# Patient Record
Sex: Female | Born: 1975 | Race: White | Hispanic: No | Smoking: Never smoker
Health system: Southern US, Community
[De-identification: ages and names within clinical notes are randomized; demographics above are authoritative.]

## PROBLEM LIST (undated history)

## (undated) DIAGNOSIS — G4730 Sleep apnea, unspecified: Secondary | ICD-10-CM

## (undated) DIAGNOSIS — G5600 Carpal tunnel syndrome, unspecified upper limb: Secondary | ICD-10-CM

## (undated) DIAGNOSIS — D649 Anemia, unspecified: Secondary | ICD-10-CM

## (undated) DIAGNOSIS — R112 Nausea with vomiting, unspecified: Secondary | ICD-10-CM

## (undated) DIAGNOSIS — K649 Unspecified hemorrhoids: Secondary | ICD-10-CM

## (undated) DIAGNOSIS — O09529 Supervision of elderly multigravida, unspecified trimester: Secondary | ICD-10-CM

## (undated) DIAGNOSIS — F419 Anxiety disorder, unspecified: Secondary | ICD-10-CM

## (undated) DIAGNOSIS — O24419 Gestational diabetes mellitus in pregnancy, unspecified control: Secondary | ICD-10-CM

## (undated) DIAGNOSIS — M1990 Unspecified osteoarthritis, unspecified site: Secondary | ICD-10-CM

## (undated) DIAGNOSIS — Z9889 Other specified postprocedural states: Secondary | ICD-10-CM

## (undated) HISTORY — PX: TONSILLECTOMY AND ADENOIDECTOMY: SUR1326

## (undated) HISTORY — DX: Unspecified hemorrhoids: K64.9

## (undated) HISTORY — DX: Other specified postprocedural states: Z98.890

## (undated) HISTORY — DX: Morbid (severe) obesity due to excess calories: E66.01

## (undated) HISTORY — PX: CHOLECYSTECTOMY: SHX55

## (undated) HISTORY — DX: Nausea with vomiting, unspecified: R11.2

## (undated) HISTORY — DX: Gestational diabetes mellitus in pregnancy, unspecified control: O24.419

## (undated) HISTORY — DX: Carpal tunnel syndrome, unspecified upper limb: G56.00

---

## 2000-09-27 ENCOUNTER — Other Ambulatory Visit: Admission: RE | Admit: 2000-09-27 | Discharge: 2000-09-27 | Payer: Self-pay | Admitting: Obstetrics and Gynecology

## 2001-03-22 ENCOUNTER — Encounter (INDEPENDENT_AMBULATORY_CARE_PROVIDER_SITE_OTHER): Payer: Self-pay | Admitting: *Deleted

## 2001-03-22 ENCOUNTER — Ambulatory Visit (HOSPITAL_COMMUNITY): Admission: RE | Admit: 2001-03-22 | Discharge: 2001-03-23 | Payer: Self-pay | Admitting: General Surgery

## 2001-03-22 ENCOUNTER — Encounter: Payer: Self-pay | Admitting: General Surgery

## 2001-10-25 ENCOUNTER — Other Ambulatory Visit: Admission: RE | Admit: 2001-10-25 | Discharge: 2001-10-25 | Payer: Self-pay | Admitting: Obstetrics and Gynecology

## 2002-11-01 ENCOUNTER — Other Ambulatory Visit: Admission: RE | Admit: 2002-11-01 | Discharge: 2002-11-01 | Payer: Self-pay | Admitting: Obstetrics & Gynecology

## 2006-07-16 ENCOUNTER — Inpatient Hospital Stay (HOSPITAL_COMMUNITY): Admission: AD | Admit: 2006-07-16 | Discharge: 2006-07-19 | Payer: Self-pay | Admitting: *Deleted

## 2010-04-16 ENCOUNTER — Emergency Department (HOSPITAL_BASED_OUTPATIENT_CLINIC_OR_DEPARTMENT_OTHER): Admission: EM | Admit: 2010-04-16 | Discharge: 2010-04-16 | Payer: Self-pay | Admitting: Emergency Medicine

## 2010-05-16 NOTE — L&D Delivery Note (Signed)
Delivery Note At 4:58 PM a viable and healthy female was delivered via Vaginal, Spontaneous Delivery (Presentation: Left Occiput Anterior).  APGAR: 8, 9 weight .  8lb 11 oz Placenta status: Intact, Spontaneous.  Cord: 3 vessels with the following complications: None.  Cord pH: none  Anesthesia: Epidural  Episiotomy: None Lacerations: None Suture Repair: none Est. Blood Loss (mL): 400  Mom to postpartum.  Baby to nursery-stable.  Egbert Seidel A 04/16/2011, 5:29 PM

## 2010-07-27 LAB — DIFFERENTIAL
Basophils Absolute: 0.1 10*3/uL (ref 0.0–0.1)
Basophils Relative: 1 % (ref 0–1)
Eosinophils Absolute: 0.1 10*3/uL (ref 0.0–0.7)
Eosinophils Relative: 1 % (ref 0–5)
Lymphocytes Relative: 7 % — ABNORMAL LOW (ref 12–46)
Lymphs Abs: 1.1 10*3/uL (ref 0.7–4.0)
Monocytes Absolute: 1.1 10*3/uL — ABNORMAL HIGH (ref 0.1–1.0)
Monocytes Relative: 7 % (ref 3–12)
Neutro Abs: 13.7 10*3/uL — ABNORMAL HIGH (ref 1.7–7.7)
Neutrophils Relative %: 85 % — ABNORMAL HIGH (ref 43–77)

## 2010-07-27 LAB — URINALYSIS, ROUTINE W REFLEX MICROSCOPIC
Bilirubin Urine: NEGATIVE
Glucose, UA: NEGATIVE mg/dL
Hgb urine dipstick: NEGATIVE
Ketones, ur: NEGATIVE mg/dL
Nitrite: NEGATIVE
Protein, ur: NEGATIVE mg/dL
Specific Gravity, Urine: 1.004 — ABNORMAL LOW (ref 1.005–1.030)
Urobilinogen, UA: 1 mg/dL (ref 0.0–1.0)
pH: 6.5 (ref 5.0–8.0)

## 2010-07-27 LAB — CBC
HCT: 38.6 % (ref 36.0–46.0)
Hemoglobin: 13.2 g/dL (ref 12.0–15.0)
MCH: 29.8 pg (ref 26.0–34.0)
MCHC: 34.1 g/dL (ref 30.0–36.0)
MCV: 87.4 fL (ref 78.0–100.0)
Platelets: 255 10*3/uL (ref 150–400)
RBC: 4.42 MIL/uL (ref 3.87–5.11)
RDW: 12.3 % (ref 11.5–15.5)
WBC: 16.1 10*3/uL — ABNORMAL HIGH (ref 4.0–10.5)

## 2010-07-27 LAB — BASIC METABOLIC PANEL
BUN: 11 mg/dL (ref 6–23)
CO2: 25 mEq/L (ref 19–32)
Calcium: 8.7 mg/dL (ref 8.4–10.5)
Chloride: 102 mEq/L (ref 96–112)
Creatinine, Ser: 0.5 mg/dL (ref 0.4–1.2)
GFR calc Af Amer: >60 mL/min (ref >60)
GFR calc non Af Amer: >60 mL/min (ref >60)
Glucose, Bld: 113 mg/dL — ABNORMAL HIGH (ref 70–99)
Potassium: 4.7 mEq/L (ref 3.5–5.1)
Sodium: 140 mEq/L (ref 135–145)

## 2010-07-27 LAB — GC/CHLAMYDIA PROBE AMP, GENITAL
Chlamydia, DNA Probe: NEGATIVE
GC Probe Amp, Genital: NEGATIVE

## 2010-07-27 LAB — WET PREP, GENITAL
Trich, Wet Prep: NONE SEEN
Yeast Wet Prep HPF POC: NONE SEEN

## 2010-07-27 LAB — PREGNANCY, URINE: Preg Test, Ur: NEGATIVE

## 2010-08-23 LAB — HIV ANTIBODY (ROUTINE TESTING W REFLEX): HIV: NONREACTIVE

## 2010-08-23 LAB — RUBELLA ANTIBODY, IGM: Rubella: IMMUNE

## 2010-08-23 LAB — RPR: RPR: NONREACTIVE

## 2010-09-17 ENCOUNTER — Other Ambulatory Visit: Payer: Self-pay | Admitting: Obstetrics and Gynecology

## 2010-09-17 LAB — GC/CHLAMYDIA PROBE AMP, GENITAL: Gonorrhea: NEGATIVE

## 2010-10-01 NOTE — Op Note (Signed)
[facility]  Patient:    Katie [name], Katie [name] Visit Number: [id number] MRN: [id number]          Service Type: DSU Location: [phone] Attending Physician:  Arlis Porta Dictated by:   Adolph Pollack, M.D. Admit Date:  03/22/2001   CC:         Barbette Hair. Arlyce Dice, M.D. [facility]  Doreatha Lew, M.D.   Operative Report  PREOPERATIVE DIAGNOSIS:  Symptomatic cholelithiasis.  POSTOPERATIVE DIAGNOSIS:  Chronic calculous cholecystitis.  PROCEDURE:  Laparoscopic cholecystectomy with intraoperative cholangiogram.  SURGEON:  Adolph Pollack, M.D.  ASSISTANT:  Anselm Pancoast. Zachery Dakins, M.D.  ANESTHESIA:  General.  INDICATION:  Katie [name] is a 35 year old female who is 5 feet 2 inches tall and 300 pounds.  She has been having some epigastric and right upper quadrant pain for approximately the past seven to eight months.  She has had an evaluation by Dr. Arlyce Dice.  She was placed on a proton pump inhibitor, but her symptoms continued.  An ultrasound demonstrated multiple gallstones with a common bile duct diameter of 7.2 mm.  She has a mild elevation of her AST and ALT on laboratory evaluation.  The frequency of her episodes of pain was increasing, and now she is brought to the operating room for elective cholecystectomy.  The procedure and risks were explained to her preoperatively.  DESCRIPTION OF PROCEDURE:  She was placed supine on the operating table and a general anesthetic was administered.  She had been given subcutaneous heparin prior to the operation.  She was also given intravenous ciprofloxacin.  The abdomen was sterilely prepped and draped.  A local anesthetic was infiltrated in the supraumbilical region and a supraumbilical incision made, incising the skin sharply.  Using blunt dissection, the subcutaneous tissue was separated and midline fascia identified.  A 1-1.5 cm incision was made at the midline fascia.  A  pursestring suture was placed around the fascial edges.  Using blunt dissection and under direct vision, the peritoneal cavity was entered. A Hasson trocar was introduced into the peritoneal cavity and a pneumoperitoneum created by insufflation of CO2 gas.  The patient was then placed in the appropriate position, and the laparoscope was introduced.  The fundus of the gallbladder could be seen and was partially intrahepatic.  Under direct visualization an 11 mm trocar was placed into the abdominal cavity through an epigastric incision and two 5 mm trocars placed in the right midabdomen into the abdominal cavity under direct vision.  The fundus of the gallbladder was grasped and the infundibulum was also manipulated and completely mobilized.  I was able to identify the cystic duct and isolate it, create a window around it.  I also identified the cystic artery and created a window around it.  I placed a clip at the gallbladder-cystic duct junction.  Because of abnormal liver function tests, a cholangiogram was planned.  I made an incision in the neck of the gallbladder and then passed a cholangiocatheter into the abdominal cavity to the anterior abdominal wall.  I attempted to pass the cholangiocatheter; hoewver, a cystic duct valve prevented me from passing it to an adequate length.  I subsequently made an incision lower onto the cystic duct and was able to pass the cholangiocatheter into the cystic duct.  I then performed a cholangiogram.  Under real-time fluoroscopy, dilute contrast material was injected into the cystic duct.  The cystic duct was visualized, as was the common bile duct. Contrast emptied promptly into  the duodenum with no obvious evidence of a biliary ductal obstruction.  The final report is pending the radiologists interpretation.  Next I clipped the cystic duct three times on the staying end side and divided it.  The cystic artery was identified and clipped and  divided.  The gallbladder was then dissected free from the liver bed intact with the cautery.  Bleeding points were controlled with the cautery.  The gallbladder fossa was irrigated and no further bleeding or bile leakage was noted.  The gallbladder was then placed in an Endopouch bag.  The gallbladder fossa was once again examined, and no bleeding or bile leakage was noted.  The irrigation fluid was evacuated.  The gallbladder was removed through the supraumbilical incision in the Endopouch bag.  The subumbilical fascial defect was then closed under direct laparoscopic vision by tightening up and tying down the pursestring suture.  Further irrigation fluid was then evacuated, and all trocars were removed and pneumoperitoneum was released.  The skin incisions were then closed with 4-0 Monocryl subcuticular stitches.  Steri-Strips and sterile dressings were applied.  She tolerated the procedure well without any apparent complications and was taken to the recovery room in satisfactory condition. Dictated by:   Adolph Pollack, M.D. Attending Physician:  Arlis Porta DD:  03/22/01 TD:  03/23/01 Job: [id number]

## 2011-01-27 LAB — RPR: RPR: NONREACTIVE

## 2011-02-23 ENCOUNTER — Encounter: Payer: BC Managed Care – PPO | Attending: Obstetrics and Gynecology | Admitting: Dietician

## 2011-02-23 DIAGNOSIS — O99810 Abnormal glucose complicating pregnancy: Secondary | ICD-10-CM | POA: Insufficient documentation

## 2011-02-23 DIAGNOSIS — Z713 Dietary counseling and surveillance: Secondary | ICD-10-CM | POA: Insufficient documentation

## 2011-02-24 ENCOUNTER — Encounter: Payer: Self-pay | Admitting: Dietician

## 2011-02-24 NOTE — Progress Notes (Addendum)
  Patient was seen on 02/23/2011 for Gestational Diabetes self-management class at the [facility]. The following learning objectives were met by the patient during this course:   States the definition of Gestational Diabetes  States why dietary management is important in controlling blood glucose  Describes the effects each nutrient has on blood glucose levels  Demonstrates ability to create a balanced meal plan  Demonstrates carbohydrate counting   States when to check blood glucose levels  Demonstrates proper blood glucose monitoring techniques  States the effect of stress and exercise on blood glucose levels  States the importance of limiting caffeine and abstaining from alcohol and smoking  Blood glucose monitor given: Has her own glucose meter Blood Glucose reading:77  Patient instructed to monitor glucose levels:Fasting and two hours post meals. FBS: 60 - <90 1 hour: <140 2 hour: <120  *Patient received handouts:  Nutrition Diabetes and Pregnancy  Carbohydrate Counting List  Patient will be seen for follow-up as needed.

## 2011-03-21 LAB — STREP B DNA PROBE: GBS: NEGATIVE

## 2011-04-11 ENCOUNTER — Telehealth (HOSPITAL_COMMUNITY): Payer: Self-pay | Admitting: *Deleted

## 2011-04-11 ENCOUNTER — Encounter (HOSPITAL_COMMUNITY): Payer: Self-pay | Admitting: *Deleted

## 2011-04-11 NOTE — Telephone Encounter (Signed)
Preadmission screen  

## 2011-04-12 ENCOUNTER — Encounter (HOSPITAL_COMMUNITY): Payer: Self-pay | Admitting: *Deleted

## 2011-04-12 ENCOUNTER — Telehealth (HOSPITAL_COMMUNITY): Payer: Self-pay | Admitting: *Deleted

## 2011-04-12 NOTE — Telephone Encounter (Signed)
Preadmission screen  

## 2011-04-15 ENCOUNTER — Inpatient Hospital Stay (HOSPITAL_COMMUNITY)
Admission: RE | Admit: 2011-04-15 | Discharge: 2011-04-18 | DRG: 372 | Disposition: A | Discharge status: Home / Self Care | Payer: BC Managed Care – PPO | Source: Ambulatory Visit | Attending: Obstetrics and Gynecology | Admitting: Obstetrics and Gynecology

## 2011-04-15 ENCOUNTER — Encounter (HOSPITAL_COMMUNITY): Payer: Self-pay

## 2011-04-15 DIAGNOSIS — O99814 Abnormal glucose complicating childbirth: Principal | ICD-10-CM | POA: Diagnosis present

## 2011-04-15 DIAGNOSIS — E669 Obesity, unspecified: Secondary | ICD-10-CM | POA: Diagnosis present

## 2011-04-15 DIAGNOSIS — O24419 Gestational diabetes mellitus in pregnancy, unspecified control: Secondary | ICD-10-CM | POA: Diagnosis present

## 2011-04-15 HISTORY — DX: Sleep apnea, unspecified: G47.30

## 2011-04-15 LAB — CBC
Hemoglobin: 11 g/dL — ABNORMAL LOW (ref 12.0–15.0)
MCHC: 32.6 g/dL (ref 30.0–36.0)
Platelets: 272 10*3/uL (ref 150–400)
RBC: 4.1 MIL/uL (ref 3.87–5.11)

## 2011-04-15 LAB — GLUCOSE, CAPILLARY: Glucose-Capillary: 94 mg/dL (ref 70–99)

## 2011-04-15 MED ORDER — ACETAMINOPHEN 325 MG PO TABS: 650.0000 mg | ORAL_TABLET | ORAL | Status: DC | PRN

## 2011-04-15 MED ORDER — OXYCODONE-ACETAMINOPHEN 5-325 MG PO TABS: 2.0000 | ORAL_TABLET | ORAL | Status: DC | PRN

## 2011-04-15 MED ORDER — OXYTOCIN 10 UNIT/ML IJ SOLN: 10.0000 [IU] | Freq: Once | INTRAMUSCULAR | Status: DC

## 2011-04-15 MED ORDER — OXYTOCIN 20 UNITS IN LACTATED RINGERS INFUSION - SIMPLE: 125.0000 mL/h | Freq: Once | INTRAVENOUS | Status: DC

## 2011-04-15 MED ORDER — TERBUTALINE SULFATE 1 MG/ML IJ SOLN: 0.2500 mg | Freq: Once | INTRAMUSCULAR | Status: AC | PRN

## 2011-04-15 MED ORDER — MISOPROSTOL 25 MCG QUARTER TABLET
25.0000 ug | ORAL_TABLET | ORAL | Status: DC | PRN
  Administered 2011-04-15 – 2011-04-16 (×3): 25 ug via VAGINAL
  Filled 2011-04-15 (×3): qty 0.25

## 2011-04-15 MED ORDER — ZOLPIDEM TARTRATE 10 MG PO TABS
10.0000 mg | ORAL_TABLET | Freq: Every evening | ORAL | Status: DC | PRN
  Administered 2011-04-15: 10 mg via ORAL
  Filled 2011-04-15: qty 1

## 2011-04-15 MED ORDER — LIDOCAINE HCL (PF) 1 % IJ SOLN
30.0000 mL | INTRAMUSCULAR | Status: DC | PRN
  Filled 2011-04-15: qty 30

## 2011-04-15 MED ORDER — ONDANSETRON HCL 4 MG/2ML IJ SOLN
4.0000 mg | Freq: Four times a day (QID) | INTRAMUSCULAR | Status: DC | PRN
  Administered 2011-04-16: 4 mg via INTRAVENOUS
  Filled 2011-04-15: qty 2

## 2011-04-15 MED ORDER — LACTATED RINGERS IV SOLN
500.0000 mL | INTRAVENOUS | Status: DC | PRN
  Administered 2011-04-16: 1000 mL via INTRAVENOUS

## 2011-04-15 MED ORDER — LACTATED RINGERS IV SOLN
INTRAVENOUS | Status: DC
  Administered 2011-04-15 – 2011-04-16 (×4): via INTRAVENOUS

## 2011-04-15 MED ORDER — OXYTOCIN 20 UNITS IN LACTATED RINGERS INFUSION - SIMPLE
1.0000 m[IU]/min | INTRAVENOUS | Status: DC
  Administered 2011-04-16: 2 m[IU]/min via INTRAVENOUS
  Filled 2011-04-15: qty 1000

## 2011-04-15 MED ORDER — OXYTOCIN BOLUS FROM INFUSION
500.0000 mL | Freq: Once | INTRAVENOUS | Status: DC
  Filled 2011-04-15: qty 500

## 2011-04-15 MED ORDER — CITRIC ACID-SODIUM CITRATE 334-500 MG/5ML PO SOLN
30.0000 mL | ORAL | Status: DC | PRN
  Administered 2011-04-15: 30 mL via ORAL
  Filled 2011-04-15: qty 15

## 2011-04-15 MED ORDER — IBUPROFEN 600 MG PO TABS: 600.0000 mg | ORAL_TABLET | Freq: Four times a day (QID) | ORAL | Status: DC | PRN

## 2011-04-15 NOTE — Plan of Care (Signed)
Problem: Consults Goal: Birthing Suites Patient Information Press F2 to bring up selections list Outcome: Completed/Met Date Met:  04/15/11  Pt 37-[redacted] weeks EGA  Comments:  39.3 week induction for gestational diabetes

## 2011-04-15 NOTE — H&P (Signed)
Katie [name] is a 35 y.o. female G2P1001 MWF now @ 39 3/[redacted] weeks gestation presenting for induction 2nd to Class A2 gestational diabetes. History OB History    Grav Para Term Preterm Abortions TAB SAB Ect Mult Living   2 1 1       1      Past Medical History  Diagnosis Date  . Gestational diabetes   . Morbid obesity   . Carpal tunnel syndrome     only first pregnancy  . Asthma     as a child  . PONV (postoperative nausea and vomiting)   . Sleep apnea    Past Surgical History  Procedure Date  . Cholecystectomy     2002   Family History: family history includes Diabetes in her maternal grandfather and mother; Hypertension in her mother; Migraines in her mother; Multiple sclerosis in her mother; and Thyroid disease in her mother.  There is no history of Anesthesia problems, and Hypotension, and Malignant hyperthermia, and Pseudochol deficiency, . Social History:  reports that she has never smoked. She has never used smokeless tobacco. She reports that she does not drink alcohol or use illicit drugs.  ROS neg  Dilation: 1 Effacement (%): 50 Station: -3 Exam by:: Katie [name], Katie [name] Height 5\' 2"  (1.575 m), weight 141.976 kg (313 lb). Maternal Exam:  Uterine Assessment: Contraction frequency is irregular.   Abdomen: Patient reports no abdominal tenderness. Fetal presentation: vertex  Introitus: Normal vulva. Ferning test: not done.  Nitrazine test: not done.  Pelvis: adequate for delivery.   Cervix: Cervix evaluated by digital exam.     Physical Exam  Constitutional: She appears well-developed and well-nourished.  HENT:  Head: Normocephalic.  Eyes: EOM are normal.  Neck: Neck supple.  Cardiovascular: Normal rate and regular rhythm.   Respiratory: Breath sounds normal.  GI: Bowel sounds are normal.  Musculoskeletal: She exhibits edema.       2(+)  Skin: Skin is warm and dry.  Psychiatric: She has a normal mood and affect.    Prenatal labs: ABO, Rh:  B  positive Antibody:  neg Rubella:  Immune RPR:   NR HBsAg:   neg HIV:   neg GBS: Negative (11/05 0000)  Tracing reactive irreg ctx Assessment/Plan: Class A2 GDM Term gestation P) Admit routine labs cytotec. CBG q 4 hr low dose pitocin in am. Analgesic   Katie [name] A 04/15/2011, 11:17 PM

## 2011-04-16 ENCOUNTER — Encounter (HOSPITAL_COMMUNITY): Payer: Self-pay

## 2011-04-16 ENCOUNTER — Inpatient Hospital Stay (HOSPITAL_COMMUNITY): Payer: BC Managed Care – PPO | Admitting: Anesthesiology

## 2011-04-16 ENCOUNTER — Encounter (HOSPITAL_COMMUNITY): Payer: Self-pay | Admitting: Anesthesiology

## 2011-04-16 LAB — GLUCOSE, CAPILLARY
Glucose-Capillary: 88 mg/dL (ref 70–99)
Glucose-Capillary: 79 mg/dL (ref 70–99)

## 2011-04-16 LAB — RPR: RPR Ser Ql: NONREACTIVE

## 2011-04-16 MED ORDER — EPHEDRINE 5 MG/ML INJ: 10.0000 mg | INTRAVENOUS | Status: DC | PRN

## 2011-04-16 MED ORDER — WITCH HAZEL-GLYCERIN EX PADS: 1.0000 "application " | MEDICATED_PAD | CUTANEOUS | Status: DC | PRN

## 2011-04-16 MED ORDER — ONDANSETRON HCL 4 MG PO TABS: 4.0000 mg | ORAL_TABLET | ORAL | Status: DC | PRN

## 2011-04-16 MED ORDER — OXYCODONE-ACETAMINOPHEN 5-325 MG PO TABS: 1.0000 | ORAL_TABLET | ORAL | Status: DC | PRN

## 2011-04-16 MED ORDER — BENZOCAINE-MENTHOL 20-0.5 % EX AERO
INHALATION_SPRAY | CUTANEOUS | Status: AC
  Administered 2011-04-16: 1 via TOPICAL
  Filled 2011-04-16: qty 56

## 2011-04-16 MED ORDER — FENTANYL 2.5 MCG/ML BUPIVACAINE 1/10 % EPIDURAL INFUSION (WH - ANES)
14.0000 mL/h | INTRAMUSCULAR | Status: DC
  Administered 2011-04-16 (×2): 14 mL/h via EPIDURAL
  Filled 2011-04-16 (×2): qty 60

## 2011-04-16 MED ORDER — PHENYLEPHRINE 40 MCG/ML (10ML) SYRINGE FOR IV PUSH (FOR BLOOD PRESSURE SUPPORT): 80.0000 ug | PREFILLED_SYRINGE | INTRAVENOUS | Status: DC | PRN

## 2011-04-16 MED ORDER — DIPHENHYDRAMINE HCL 50 MG/ML IJ SOLN: 12.5000 mg | INTRAMUSCULAR | Status: DC | PRN

## 2011-04-16 MED ORDER — EPHEDRINE 5 MG/ML INJ
10.0000 mg | INTRAVENOUS | Status: DC | PRN
  Filled 2011-04-16: qty 4

## 2011-04-16 MED ORDER — PHENYLEPHRINE 40 MCG/ML (10ML) SYRINGE FOR IV PUSH (FOR BLOOD PRESSURE SUPPORT)
80.0000 ug | PREFILLED_SYRINGE | INTRAVENOUS | Status: DC | PRN
  Filled 2011-04-16: qty 5

## 2011-04-16 MED ORDER — NALBUPHINE HCL 10 MG/ML IJ SOLN
10.0000 mg | INTRAMUSCULAR | Status: DC | PRN
  Filled 2011-04-16: qty 1

## 2011-04-16 MED ORDER — SIMETHICONE 80 MG PO CHEW
80.0000 mg | CHEWABLE_TABLET | ORAL | Status: DC | PRN
  Administered 2011-04-16: 80 mg via ORAL

## 2011-04-16 MED ORDER — LANOLIN HYDROUS EX OINT: TOPICAL_OINTMENT | CUTANEOUS | Status: DC | PRN

## 2011-04-16 MED ORDER — TETANUS-DIPHTH-ACELL PERTUSSIS 5-2.5-18.5 LF-MCG/0.5 IM SUSP
0.5000 mL | Freq: Once | INTRAMUSCULAR | Status: AC
  Administered 2011-04-17: 0.5 mL via INTRAMUSCULAR
  Filled 2011-04-16: qty 0.5

## 2011-04-16 MED ORDER — DIPHENHYDRAMINE HCL 25 MG PO CAPS: 25.0000 mg | ORAL_CAPSULE | Freq: Four times a day (QID) | ORAL | Status: DC | PRN

## 2011-04-16 MED ORDER — ZOLPIDEM TARTRATE 5 MG PO TABS: 5.0000 mg | ORAL_TABLET | Freq: Every evening | ORAL | Status: DC | PRN

## 2011-04-16 MED ORDER — PRENATAL PLUS 27-1 MG PO TABS
1.0000 | ORAL_TABLET | Freq: Every day | ORAL | Status: DC
  Administered 2011-04-17 – 2011-04-18 (×3): 1 via ORAL
  Filled 2011-04-16 (×2): qty 1

## 2011-04-16 MED ORDER — LACTATED RINGERS IV SOLN
500.0000 mL | Freq: Once | INTRAVENOUS | Status: AC
  Administered 2011-04-16: 1000 mL via INTRAVENOUS

## 2011-04-16 MED ORDER — DIBUCAINE 1 % RE OINT: 1.0000 "application " | TOPICAL_OINTMENT | RECTAL | Status: DC | PRN

## 2011-04-16 MED ORDER — BENZOCAINE-MENTHOL 20-0.5 % EX AERO
1.0000 "application " | INHALATION_SPRAY | CUTANEOUS | Status: DC | PRN
  Administered 2011-04-16: 1 via TOPICAL

## 2011-04-16 MED ORDER — SENNOSIDES-DOCUSATE SODIUM 8.6-50 MG PO TABS
2.0000 | ORAL_TABLET | Freq: Every day | ORAL | Status: DC
  Administered 2011-04-18: 2 via ORAL

## 2011-04-16 MED ORDER — IBUPROFEN 600 MG PO TABS
600.0000 mg | ORAL_TABLET | Freq: Four times a day (QID) | ORAL | Status: DC
  Administered 2011-04-16 – 2011-04-18 (×6): 600 mg via ORAL
  Filled 2011-04-16 (×7): qty 1

## 2011-04-16 MED ORDER — ONDANSETRON HCL 4 MG/2ML IJ SOLN: 4.0000 mg | INTRAMUSCULAR | Status: DC | PRN

## 2011-04-16 MED ORDER — LIDOCAINE HCL 1.5 % IJ SOLN
INTRAMUSCULAR | Status: DC | PRN
  Administered 2011-04-16 (×2): 5 mL via INTRADERMAL
  Administered 2011-04-16: 2 mL via INTRADERMAL

## 2011-04-16 NOTE — Progress Notes (Signed)
S: feels ok  S/p cytotec x 3  O: BS 90 VE deferred  Tracing; baseline 120  Reactive Ctx q 2-3 mins  IMP; Class A2 GDM Term gestation P) if ctx too freq, start pitocin otherwise repeat cytotec if cervix unchanged

## 2011-04-16 NOTE — Progress Notes (Signed)
Viable female infant via SVD via marie williams, CNM--apgars 8&9

## 2011-04-16 NOTE — Progress Notes (Signed)
Dedria Endres is a 35 y.o. G2P1001 at [redacted]w[redacted]d by ultrasound admitted for induction of labor due to Gestational diabetes.( Class A2)  Subjective: no complaint No chief complaint on file. Pitocin   Epidural  Objective: BP 127/68  Pulse 69  Temp(Src) 98.2 F (36.8 C) (Oral)  Resp 18  Ht 5\' 2"  (1.575 m)  Wt 141.976 kg (313 lb)  BMI 57.25 kg/m2  SpO2 100%      FHT:  FHR: 135-140 bpm, variability: moderate,  accelerations:  Present,  decelerations:  Absent UC:   regular, every 2 minutes SVE:   4/60/-3  Arom clear fluid. IUPC, ISE placed Labs: Lab Results  Component Value Date   WBC 11.2* 04/15/2011   HGB 11.0* 04/15/2011   HCT 33.7* 04/15/2011   MCV 82.2 04/15/2011   PLT 272 04/15/2011   BS 79 Assessment / Plan: Class A2 GDM Term P) Exaggerated left Sims. Cont pitocin  Cont BS check Anticipated MOD:  NSVD  Meline Russaw A 04/16/2011, 1:52 PM

## 2011-04-16 NOTE — Anesthesia Procedure Notes (Signed)
Epidural Patient location during procedure: OB Start time: 04/16/2011 11:51 AM Reason for block: procedure for pain  Staffing Performed by: anesthesiologist   Preanesthetic Checklist Completed: patient identified, site marked, surgical consent, pre-op evaluation, timeout performed, IV checked, risks and benefits discussed and monitors and equipment checked  Epidural Patient position: sitting Prep: site prepped and draped and DuraPrep Patient monitoring: continuous pulse ox and blood pressure Approach: midline Injection technique: LOR air  Needle:  Needle type: Tuohy  Needle gauge: 17 G Needle length: 9 cm Needle insertion depth: 7 cm Catheter type: closed end flexible Catheter size: 19 Gauge Catheter at skin depth: 12 cm Test dose: negative  Assessment Events: blood not aspirated, injection not painful, no injection resistance, negative IV test and no paresthesia  Additional Notes Discussed risk of headache, infection, bleeding, nerve injury and failed or incomplete block.  Patient voices understanding and wishes to proceed.

## 2011-04-16 NOTE — Anesthesia Preprocedure Evaluation (Signed)
Anesthesia Evaluation  Patient identified by MRN, date of birth, ID band Patient awake    Reviewed: Allergy & Precautions, H&P , NPO status , Patient's Chart, lab work & pertinent test results, reviewed documented beta blocker date and time   History of Anesthesia Complications (+) PONV  Airway Mallampati: III TM Distance: >3 FB Neck ROM: full    Dental  (+) Teeth Intact   Pulmonary neg pulmonary ROS, sleep apnea and Continuous Positive Airway Pressure Ventilation ,  clear to auscultation        Cardiovascular neg cardio ROS regular Normal    Neuro/Psych Negative Neurological ROS  Negative Psych ROS   GI/Hepatic negative GI ROS, Neg liver ROS,   Endo/Other  Diabetes mellitus-, Gestational, Oral Hypoglycemic AgentsMorbid obesity  Renal/GU negative Renal ROS     Musculoskeletal   Abdominal   Peds  Hematology negative hematology ROS (+)   Anesthesia Other Findings   Reproductive/Obstetrics (+) Pregnancy                           Anesthesia Physical Anesthesia Plan  ASA: III  Anesthesia Plan: Epidural   Post-op Pain Management:    Induction:   Airway Management Planned:   Additional Equipment:   Intra-op Plan:   Post-operative Plan:   Informed Consent: I have reviewed the patients History and Physical, chart, labs and discussed the procedure including the risks, benefits and alternatives for the proposed anesthesia with the patient or authorized representative who has indicated his/her understanding and acceptance.     Plan Discussed with:   Anesthesia Plan Comments:         Anesthesia Quick Evaluation

## 2011-04-16 NOTE — Progress Notes (Signed)
0900- cbg 103

## 2011-04-17 LAB — CBC
HCT: 27.4 % — ABNORMAL LOW (ref 36.0–46.0)
MCH: 27.1 pg (ref 26.0–34.0)
MCHC: 32.8 g/dL (ref 30.0–36.0)
MCV: 82.5 fL (ref 78.0–100.0)
RDW: 15.3 % (ref 11.5–15.5)

## 2011-04-17 NOTE — Anesthesia Postprocedure Evaluation (Signed)
  Anesthesia Post-op Note  Patient: Katie [name]  Procedure(s) Performed: * No procedures listed *  Patient Location: Mother/Baby  Anesthesia Type: Epidural  Level of Consciousness: awake, alert  and oriented  Airway and Oxygen Therapy: Patient Spontanous Breathing  Post-op Assessment: Patient's Cardiovascular Status Stable and Respiratory Function Stable  Post-op Vital Signs: Reviewed and stable  Complications: No apparent anesthesia complications

## 2011-04-17 NOTE — Progress Notes (Signed)
PPD#1} SVD:   S:  Pt reports feeling well Tolerating po/ Voiding without problems/ No n/v/ Bleeding is mild / Pain controlled withibuprofen (OTC)  Newborn info live female  O:  A & O x 3  VS: Blood pressure 96/56, pulse 67, temperature 98 F (36.7 C), temperature source Oral, resp. rate 20, height 5\' 2"  (1.575 m), weight 313 lb (141.976 kg), SpO2 96.00%, unknown if currently breastfeeding.  LABS:  Results for orders placed during the hospital encounter of 04/15/11 (from the past 24 hour(s))  GLUCOSE, CAPILLARY     Status: Abnormal   Collection Time   04/16/11  9:13 AM      Component Value Range   Glucose-Capillary 103 (*) 70 - 99 (mg/dL)  GLUCOSE, CAPILLARY     Status: Normal   Collection Time   04/16/11 12:55 PM      Component Value Range   Glucose-Capillary 79  70 - 99 (mg/dL)  CBC     Status: Abnormal   Collection Time   04/17/11  5:00 AM      Component Value Range   WBC 14.1 (*) 4.0 - 10.5 (K/uL)   RBC 3.32 (*) 3.87 - 5.11 (MIL/uL)   Hemoglobin 9.0 (*) 12.0 - 15.0 (g/dL)   HCT 40.9 (*) 81.1 - 46.0 (%)   MCV 82.5  78.0 - 100.0 (fL)   MCH 27.1  26.0 - 34.0 (pg)   MCHC 32.8  30.0 - 36.0 (g/dL)   RDW 91.4  78.2 - 95.6 (%)   Platelets 228  150 - 400 (K/uL)    I&O: I/O last 3 completed shifts: In: -  Out: 950 [Urine:550; Blood:400]      Lungs: chest clear, no wheezing, rales, normal symmetric air entry, Heart exam - S1, S2 normal, no murmur, no gallop, rate regular  Heart: regular rate and rhythm, S1, S2 normal, no murmur, click, rub or gallop  Abdomen: benign non-tender, without masses or organomegaly palpable juterus 1 FB above umbilicus firm nontender  Perineum: is normal/intact  Lochia: mod  Extremities:no redness or tenderness in the calves or thighs, no edema, edema 2+    A/P: PPD #1/ [id number]  2) Class A2 GDM  Doing well  Continue routine post partum orders Anticipate d/c tomorrow. 2hr GTT 8 wk pp

## 2011-04-18 MED ORDER — IBUPROFEN 600 MG PO TABS: 600.0000 mg | ORAL_TABLET | Freq: Four times a day (QID) | ORAL | Status: AC | PRN

## 2011-04-18 NOTE — Progress Notes (Signed)
Patient ID: Katie [name], female   DOB: May 09, 1976, 35 y.o.   MRN: [id number] PPD 2 SVD  S:  Reports feeling well, desires dc this am             Tolerating po/ No nausea or vomiting             Bleeding is spotting             Pain controlled withibuprofen (OTC)             Up ad lib / ambulatory  Newborn breast feeding    O:  A & O x 3, NAD, pleasant affect             VS: Blood pressure 127/72, pulse 77, temperature 97.9 F (36.6 C), temperature source Oral, resp. rate 20, height 5\' 2"  (1.575 m), weight 141.976 kg (313 lb), SpO2 95.00%, unknown if currently breastfeeding.  LABS: Lab Results  Component Value Date   WBC 14.1* 04/17/2011   HGB 9.0* 04/17/2011   HCT 27.4* 04/17/2011   MCV 82.5 04/17/2011   PLT 228 04/17/2011     I&O: I/O last 3 completed shifts: In: -  Out: 550 [Urine:550]      Lungs: Clear and unlabored  Heart: regular rate and rhythm / no mumurs  Abdomen: soft, non-tender, non-distended             Fundus: firm, non-tender, U-1  Perineum: intact  Lochia: small, rubra  Extremities: slight dependent edema    A: PPD # 2  Doing well - stable status  Breastfeeding well P:  Routine post partum orders  DC home with 6 week f/u, including 2hr OGT  Lactation consultation prn; education on methods to decrease duration to lactogenesis II  Juanetta Beets, [facility] 04/18/2011, 9:24 AM

## 2011-04-18 NOTE — Discharge Summary (Signed)
Obstetric Discharge Summary Reason for Admission: induction of labor for A2GDM Prenatal Procedures: NST and ultrasound Intrapartum Procedures: spontaneous vaginal delivery Postpartum Procedures: none Complications-Operative and Postpartum: none Hemoglobin  Date Value Range Status  04/17/2011 9.0* 12.0-15.0 (g/dL) Final     REPEATED TO VERIFY     DELTA CHECK NOTED     HCT  Date Value Range Status  04/17/2011 27.4* 36.0-46.0 (%) Final    Discharge Diagnoses: Term Pregnancy-delivered; GDM A2- delivered; Morbid Obesity  Discharge Information: Date: 04/18/2011 Activity: pelvic rest Diet: routine Medications: PNV and Ibuprofen Condition: stable Instructions: refer to practice specific booklet; 2hr OGT at 6 week f/u appointment Discharge to: home Follow-up Information    Follow up with COUSINS,SHERONETTE A, MD. Make an appointment in 6 weeks. (2 hr OGT at this visit)    Contact information:   [address] [place] [address] [phone]          Newborn Data: Live born female  Birth Weight: 8 lb 11.7 oz (3960 g) APGAR: 8, 9 Home with mother.  Katie [name] 04/18/2011, 9:31 AM

## 2011-04-19 ENCOUNTER — Inpatient Hospital Stay (HOSPITAL_COMMUNITY): Admission: AD | Admit: 2011-04-19 | Payer: Self-pay | Source: Ambulatory Visit | Admitting: Obstetrics and Gynecology

## 2011-08-31 ENCOUNTER — Encounter: Payer: Self-pay | Admitting: Internal Medicine

## 2011-09-14 ENCOUNTER — Encounter: Payer: Self-pay | Admitting: Internal Medicine

## 2011-09-14 ENCOUNTER — Ambulatory Visit (INDEPENDENT_AMBULATORY_CARE_PROVIDER_SITE_OTHER): Payer: BC Managed Care – PPO | Admitting: Internal Medicine

## 2011-09-14 VITALS — BP 122/79 | HR 93 | Temp 98.0°F | Resp 16 | Ht 63.0 in | Wt 299.0 lb

## 2011-09-14 DIAGNOSIS — Z0000 Encounter for general adult medical examination without abnormal findings: Secondary | ICD-10-CM

## 2011-09-14 DIAGNOSIS — R739 Hyperglycemia, unspecified: Secondary | ICD-10-CM

## 2011-09-14 DIAGNOSIS — Z6841 Body Mass Index (BMI) 40.0 and over, adult: Secondary | ICD-10-CM

## 2011-09-14 LAB — POCT URINALYSIS DIPSTICK
Bilirubin, UA: NEGATIVE
Blood, UA: NEGATIVE
Ketones, UA: NEGATIVE
Leukocytes, UA: NEGATIVE
Protein, UA: NEGATIVE
pH, UA: 6

## 2011-09-14 LAB — CBC WITH DIFFERENTIAL/PLATELET
Basophils Absolute: 0 10*3/uL (ref 0.0–0.1)
Eosinophils Absolute: 0.2 10*3/uL (ref 0.0–0.7)
Eosinophils Relative: 3 % (ref 0–5)
HCT: 40.5 % (ref 36.0–46.0)
MCH: 26.8 pg (ref 26.0–34.0)
MCHC: 32.6 g/dL (ref 30.0–36.0)
MCV: 82.2 fL (ref 78.0–100.0)
Monocytes Absolute: 0.5 10*3/uL (ref 0.1–1.0)
Platelets: 323 10*3/uL (ref 150–400)
RDW: 14.7 % (ref 11.5–15.5)

## 2011-09-14 LAB — LIPID PANEL
Cholesterol: 122 mg/dL (ref 0–200)
HDL: 38 mg/dL — ABNORMAL LOW (ref >39)
Total CHOL/HDL Ratio: 3.2 Ratio
VLDL: 13 mg/dL (ref 0–40)

## 2011-09-14 LAB — COMPREHENSIVE METABOLIC PANEL
ALT: 15 U/L (ref 0–35)
AST: 15 U/L (ref 0–37)
BUN: 18 mg/dL (ref 6–23)
Calcium: 9.1 mg/dL (ref 8.4–10.5)
Creat: 0.61 mg/dL (ref 0.50–1.10)
Total Bilirubin: 1.1 mg/dL (ref 0.3–1.2)

## 2011-09-14 LAB — TSH: TSH: 2.762 u[IU]/mL (ref 0.350–4.500)

## 2011-09-14 NOTE — Progress Notes (Signed)
  Subjective:    Patient ID: Katie [name], female    DOB: Jan 27, 1976, 36 y.o.   MRN: [id number]  HPIAnnual exam 5 months postpartum for baby #2 Gestational diabetes with baby #2 Has a new nutritionist after not doing well with Dr. Jennye Boroughs weight program/Cristy Durene Cal who believes that her eating his diet with some compulsive thinking/compulsive behaviors She was overweight in the first grade/her parents were overweight and try to control her food intake/there are no childhood traumas or sexual abuse but she remembers taking her teacher in first grade/there is a long history of eating for comfort She does have other strange compulsions at times but none that are clearly indicative of obsessive-compulsive disorder She describes mild anxiety that is pervasive but not inhibiting She continues with ever increasing swelling of her hands and feet and ankles and legs  Social Technical sales engineer. Happily married. Has a mild exercise program. Nonsmoker. 2 healthy children. Rare alcohol use. No  drug use.   Review of [employer]. Negative HEENT-Negative with recent eye exam Recent dental exam Respiratory negative Cardiovascular negative GI negative Endocrine negative Genitourinary negative Gynecological with normal menses and the use of an IUD 5 months postpartum Last Pap smear January 2013 within normal limits/one sexual partner Musculoskeletal negative except swelling Neurological the recent episode of vertigo for 10 minutes which resolved and has not recurred  Immunizations are up-to-date including Td at December 2012 Dr. Cherly Hensen OB/GYN    Objective:   Physical ExamMorbid obesity HEENT clear/No thyromegaly or lymphadenopathy Heart regular without murmurs rubs gallops or clicks Lungs clear Abdomen soft nontender without organomegaly or masses Lower extremities exaggerated by marked nonedematous swelling/pulses are intact/color is intact/temperature  intact Neurological is intact        Results for orders placed in visit on 09/14/11  POCT GLYCOSYLATED HEMOGLOBIN (HGB A1C)      Component Value Range   Hemoglobin A1C 5.2    POCT URINALYSIS DIPSTICK      Component Value Range   Color, UA yellow     Clarity, UA clear     Glucose, UA neg     Bilirubin, UA neg     Ketones, UA neg     Spec Grav, UA 1.010     Blood, UA neg     pH, UA 6.0     Protein, UA neg     Urobilinogen, UA 0.2     Nitrite, UA neg     Leukocytes, UA Negative      Assessment & Plan:  Problem #1 annual exam Problem #2 morbid obesity Problem #3 mild anxiety and mild obsessive thinking Problem #4 history of gestational diabetes with recent hyperglycemia but hemoglobin A1c is normal  She will continue with her nutrition therapy/we will try to schedule CBT 2 reinforce her weight loss plans and to consider whether other psychological diagnoses are Present Routine labs ordered

## 2011-09-16 ENCOUNTER — Encounter: Payer: Self-pay | Admitting: Internal Medicine

## 2012-03-14 ENCOUNTER — Ambulatory Visit (INDEPENDENT_AMBULATORY_CARE_PROVIDER_SITE_OTHER): Payer: BC Managed Care – PPO | Admitting: Family Medicine

## 2012-03-14 VITALS — BP 122/82 | HR 81 | Temp 98.2°F | Resp 18 | Ht 62.0 in | Wt 318.0 lb

## 2012-03-14 DIAGNOSIS — R079 Chest pain, unspecified: Secondary | ICD-10-CM

## 2012-03-14 NOTE — Progress Notes (Signed)
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  She fell asleep without CPAP machine last night and awoke 3 am with abrupt shortness of breath, able to get back asleep despite feeling hot and nauseated.  She developed brief episodes of left chest pain and tightness lasting a few minutes each time without SOB but accompanied by nausea.  No pain at present.  No leg pains  Patient has IUD  No cough.  Objective:  NAD Chest:  Clear without rub Extrem:   Some edema, nontender Heart:  No murmur or gallop or rub 97-98% pulse ox  Assessment:  Atypical chest pain  Plan:   Baby aspirin daily for 10 days

## 2012-04-23 ENCOUNTER — Telehealth: Payer: Self-pay

## 2012-04-23 NOTE — Telephone Encounter (Signed)
There are multiple lice treatments over the counter that she can use if needed i.e. RID, A-200, Pronto, Clear, 1% permethrin

## 2012-04-23 NOTE — Telephone Encounter (Signed)
PT'S DAUGHTER HAS LICE AND THE PEDIATRICIAN WROTE HER A SCRIPT FOR A SHAMPOO.  SHE WOULD LIKE TO KNOW IF WE COULD WRITE A PRESCRIPTION FOR HER ALSO JUST AS A PRECAUTION. [phone]

## 2012-04-24 NOTE — Telephone Encounter (Signed)
Thanks, I called patient to advise. Left detailed message, also advised to wash linens in hot water, wait 3 days and repeat.

## 2012-05-24 ENCOUNTER — Ambulatory Visit: Payer: BC Managed Care – PPO | Admitting: Physician Assistant

## 2012-05-24 VITALS — BP 114/79 | HR 85 | Temp 98.2°F | Resp 20 | Ht 63.5 in | Wt 320.6 lb

## 2012-05-24 DIAGNOSIS — J029 Acute pharyngitis, unspecified: Secondary | ICD-10-CM

## 2012-05-24 DIAGNOSIS — D72829 Elevated white blood cell count, unspecified: Secondary | ICD-10-CM

## 2012-05-24 DIAGNOSIS — R509 Fever, unspecified: Secondary | ICD-10-CM

## 2012-05-24 DIAGNOSIS — R5082 Postprocedural fever: Secondary | ICD-10-CM

## 2012-05-24 LAB — POCT CBC
Granulocyte percent: 78.3 %G (ref 37–80)
Lymph, poc: 2.4 (ref 0.6–3.4)
MCHC: 31 g/dL — AB (ref 31.8–35.4)
MPV: 9.2 fL (ref 0–99.8)
POC Granulocyte: 11.6 — AB (ref 2–6.9)
POC LYMPH PERCENT: 16.3 %L (ref 10–50)
POC MID %: 5.4 %M (ref 0–12)
Platelet Count, POC: 316 10*3/uL (ref 142–424)
RDW, POC: 14.8 %

## 2012-05-24 MED ORDER — GUAIFENESIN ER 1200 MG PO TB12: 1.0000 | ORAL_TABLET | Freq: Two times a day (BID) | ORAL | Status: DC

## 2012-05-24 MED ORDER — FIRST-DUKES MOUTHWASH MT SUSP: 5.0000 mL | OROMUCOSAL | Status: AC | PRN

## 2012-05-24 MED ORDER — CEFDINIR 300 MG PO CAPS: 300.0000 mg | ORAL_CAPSULE | Freq: Two times a day (BID) | ORAL | Status: AC

## 2012-05-24 NOTE — Progress Notes (Signed)
[address] [place] [address]   Phone [phone]  Subjective:    Patient ID: Katie [name], female    DOB: 1975/10/20, 37 y.o.   MRN: [id number]  HPI Pt presents to clinic with 4 day h/o sore throat with fever.  She has no other cold symptoms.  When this 1st started she felt like it was viral but is concerned because it is lasting more than normal.  Her 1 y/o daughter has a really red throat and the ped has tested her twice for strep and the tests have been neg both times.  Pt has been using round the clock ibuprofen and that is the only thing that helps her pain and keep her temp normal, fever has been around 101 for several days.  No myalgias or HAs.   Review of Systems  Constitutional: Positive for fever and chills.  HENT: Positive for sore throat. Negative for congestion, rhinorrhea and postnasal drip.   Respiratory: Negative for cough.   Gastrointestinal: Negative for nausea, vomiting and diarrhea.  Genitourinary: Negative for dysuria, urgency, frequency and pelvic pain.  Musculoskeletal: Negative for myalgias.  Neurological: Negative for headaches.       Objective:   Physical Exam  Vitals reviewed. Constitutional: She is oriented to person, place, and time. She appears well-developed and well-nourished.  HENT:  Head: Normocephalic and atraumatic.  Right Ear: Hearing, tympanic membrane, external ear and ear canal normal.  Left Ear: Hearing, tympanic membrane, external ear and ear canal normal.  Nose: No mucosal edema.  Mouth/Throat: No uvula swelling. Posterior oropharyngeal edema (hypertrophic tonsils) present. No oropharyngeal exudate, posterior oropharyngeal erythema or tonsillar abscesses.       PND seen in posterior pharynx.  Eyes: Conjunctivae normal are normal.  Cardiovascular: Normal rate, regular rhythm and normal heart sounds.   Pulmonary/Chest: Effort normal and breath sounds normal.  Neurological: She is alert and oriented to person, place, and time.   Skin: Skin is warm and dry.  Psychiatric: She has a normal mood and affect. Her behavior is normal. Judgment and thought content normal.   Results for orders placed in visit on 05/24/12  POCT RAPID STREP A (OFFICE)      Component Value Range   Rapid Strep A Screen Negative  Negative  POCT CBC      Component Value Range   WBC 14.8 (*) 4.6 - 10.2 K/uL   Lymph, poc 2.4  0.6 - 3.4   POC LYMPH PERCENT 16.3  10 - 50 %L   MID (cbc) 0.8  0 - 0.9   POC MID % 5.4  0 - 12 %M   POC Granulocyte 11.6 (*) 2 - 6.9   Granulocyte percent 78.3  37 - 80 %G   RBC 4.89  4.04 - 5.48 M/uL   Hemoglobin 13.4  12.2 - 16.2 g/dL   HCT, POC 21.3  08.6 - 47.9 %   MCV 88.4  80 - 97 fL   MCH, POC 27.4  27 - 31.2 pg   MCHC 31.0 (*) 31.8 - 35.4 g/dL   RDW, POC 57.8     Platelet Count, POC 316  142 - 424 K/uL   MPV 9.2  0 - 99.8 fL        Assessment & Plan:   1. Acute pharyngitis  POCT rapid strep A, POCT CBC, Culture, Group A Strep, cefdinir (OMNICEF) 300 MG capsule, Diphenhyd-Hydrocort-Nystatin (FIRST-DUKES MOUTHWASH) SUSP, Guaifenesin (MUCINEX MAXIMUM STRENGTH) 1200 MG TB12  2. Fever  POCT rapid strep  A, POCT CBC, Culture, Group A Strep  3. Leukocytosis  cefdinir (OMNICEF) 300 MG capsule   Due to length of illness and leukocytosis will cover for strep/tonsilillis and sinus infection.  Pt understands and agrees with the above plan.

## 2012-05-26 LAB — CULTURE, GROUP A STREP: Organism ID, Bacteria: NORMAL

## 2012-06-10 ENCOUNTER — Telehealth: Payer: Self-pay

## 2012-06-10 ENCOUNTER — Ambulatory Visit (INDEPENDENT_AMBULATORY_CARE_PROVIDER_SITE_OTHER): Payer: BC Managed Care – PPO | Admitting: Physician Assistant

## 2012-06-10 VITALS — BP 115/79 | HR 79 | Temp 97.4°F | Resp 16 | Ht 63.0 in | Wt 323.8 lb

## 2012-06-10 DIAGNOSIS — D72829 Elevated white blood cell count, unspecified: Secondary | ICD-10-CM

## 2012-06-10 DIAGNOSIS — J029 Acute pharyngitis, unspecified: Secondary | ICD-10-CM

## 2012-06-10 LAB — POCT CBC
Granulocyte percent: 75 %G (ref 37–80)
HCT, POC: 44.7 % (ref 37.7–47.9)
Hemoglobin: 13.8 g/dL (ref 12.2–16.2)
MCV: 88 fL (ref 80–97)
POC Granulocyte: 11 — AB (ref 2–6.9)
POC LYMPH PERCENT: 19.4 %L (ref 10–50)
RBC: 5.04 M/uL (ref 4.04–5.48)
RDW, POC: 379 %

## 2012-06-10 MED ORDER — PREDNISONE 10 MG PO TABS: ORAL_TABLET | ORAL | Status: DC

## 2012-06-10 MED ORDER — AZITHROMYCIN 250 MG PO TABS: ORAL_TABLET | ORAL | Status: DC

## 2012-06-10 NOTE — Telephone Encounter (Signed)
Pt came in to be seen today 

## 2012-06-10 NOTE — Telephone Encounter (Signed)
PATIENT IS STILL HAVING SYMPTOMS AFTER ROUND OF ANTIBIOTICS AND WOULD LIKE TO KNOW IF WE CAN CALL IN ANOTHER ONE FOR HER.  BEST [phone]

## 2012-06-10 NOTE — Telephone Encounter (Signed)
Pt is calling again regarding her wanting another antibiotic called in, she states that she is experiencing low grade fever and throat pain. Best# [phone]

## 2012-06-11 ENCOUNTER — Encounter: Payer: Self-pay | Admitting: Physician Assistant

## 2012-06-11 NOTE — Progress Notes (Signed)
[address] [place] [address]   Phone [phone]  Subjective:    Patient ID: Katie [name], female    DOB: 05-08-1976, 37 y.o.   MRN: [id number]  HPI Pt presents to clinic for recheck.  She was seen last 1/9 with significant pharyngitis/tonsilitis.  We treated with antibiotics that patient did not completely take correctly.  She had a neg throat culture.  Today she is back because she still has significant pain in her throat - though now is it less with swalowing and more in her neck but she is having difficulty swallowing things due swollen feeling in her throat. She has been using mucinex and only over the last 2 days has she felt like she is getting congested.  She has not been running a fever.  Her daughter is completely better.  She has had no new exposures.  Review of Systems  Constitutional: Negative for fever and chills.  HENT: Positive for congestion, sore throat, rhinorrhea, trouble swallowing (due to feeling like something is in her throat not pain) and postnasal drip (mild).   Respiratory: Negative for cough.   Gastrointestinal: Negative for nausea.  Neurological: Negative for headaches.       Objective:   Physical Exam  Vitals reviewed. Constitutional: She is oriented to person, place, and time. She appears well-developed and well-nourished.  HENT:  Head: Normocephalic and atraumatic.  Right Ear: Hearing, tympanic membrane, external ear and ear canal normal.  Left Ear: Hearing, tympanic membrane, external ear and ear canal normal.  Nose: Nose normal.  Mouth/Throat: Posterior oropharyngeal edema present. No oropharyngeal exudate or posterior oropharyngeal erythema.       Hypertrophic tonsils 2-3+ almost kissing tonsils - no uvular deviation, no soft palate edema  Eyes: Conjunctivae normal are normal.  Neck: Neck supple.  Cardiovascular: Normal rate, regular rhythm and normal heart sounds.   Pulmonary/Chest: Effort normal and breath sounds normal.    Lymphadenopathy:       Head (right side): Tonsillar adenopathy present. No preauricular, no posterior auricular and no occipital adenopathy present.       Head (left side): Tonsillar adenopathy present. No preauricular, no posterior auricular and no occipital adenopathy present.    She has cervical adenopathy.       Right cervical: Superficial cervical adenopathy present. No posterior cervical adenopathy present.      Left cervical: Superficial cervical adenopathy present. No posterior cervical adenopathy present.       Right: No supraclavicular adenopathy present.       Left: No supraclavicular adenopathy present.  Neurological: She is alert and oriented to person, place, and time.  Skin: Skin is warm and dry.  Psychiatric: She has a normal mood and affect. Her behavior is normal. Judgment and thought content normal.      Results for orders placed in visit on 06/10/12  POCT CBC      Component Value Range   WBC 14.6 (*) 4.6 - 10.2 K/uL   Lymph, poc 2.8  0.6 - 3.4   POC LYMPH PERCENT 19.4  10 - 50 %L   MID (cbc) 0.8  0 - 0.9   POC MID % 5.6  0 - 12 %M   POC Granulocyte 11.0 (*) 2 - 6.9   Granulocyte percent 75.0  37 - 80 %G   RBC 5.04  4.04 - 5.48 M/uL   Hemoglobin 13.8  12.2 - 16.2 g/dL   HCT, POC 84.1  32.4 - 47.9 %   MCV 88.0  80 -  97 fL   MCH, POC 27.4  27 - 31.2 pg   MCHC 13.5 (*) 31.8 - 35.4 g/dL   RDW, POC 213     Platelet Count, POC 9.6 (*) 142 - 424 K/uL   MPV    0 - 99.8 fL       Assessment & Plan:   1. Acute pharyngitis  POCT CBC, azithromycin (ZITHROMAX Z-PAK) 250 MG tablet, predniSONE (DELTASONE) 10 MG tablet, Epstein-Barr virus VCA, IgM  2. Leukocytosis     Due to elevated white count will cover with zithromax due to possibility of infection not adequately treated due to interruption in antibiotic course.  Pt has never had a cold sore and do not see any blisters in oropharynx and due to the length of time do not believe that this is oral HSV first outbreak.   There is a possibility of mono but due to age and no lymphocytic shift that would be less likely but we will check.  Due to large size of tonsils and pt sensation of difficulty swallowing will treat with steroids, SE and how to take d/w pt.  Due to elevated white count and lymph node we do want to monitor this to resolution but if after this course if no improvement will referral to ENT.  I think her lymph nodes are reacting to enlarged tonsils and pt will improve.  Answered pt's questions and she understands and agrees with the plan.

## 2012-06-13 ENCOUNTER — Telehealth: Payer: Self-pay

## 2012-06-13 LAB — EPSTEIN-BARR VIRUS VCA, IGM: EBV VCA IgM: <10 U/mL (ref <36.0)

## 2012-06-13 NOTE — Telephone Encounter (Signed)
Has not resulted yet. Called patient to advise, left message.

## 2012-06-13 NOTE — Telephone Encounter (Signed)
Pt would like to know if her results for her mono test are in yet, also pt states that she wants a referral to [facility] if necessary. Best#  [phone]

## 2012-11-26 IMAGING — CT CT ABD-PELV W/ CM
2 of 4 series · 16 of 46 positions shown, 18 images · IV contrast (APPLIED)
Comparison: None.

CLINICAL DATA: Right-sided abdominal pain, fever

CT ABDOMEN AND PELVIS WITH CONTRAST
TECHNIQUE: Multidetector CT imaging of the abdomen and pelvis was
performed following the standard protocol during bolus
administration of intravenous contrast.
Contrast: 100 ml Rmnipaque-PVV

[Series 2: abd/pelvis 5.0 b31f · axial · 0.80mm/px · z∈[-896,-481]mm · 13 of 91 slices shown, 15 images]
[im 4/91  soft-tissue]
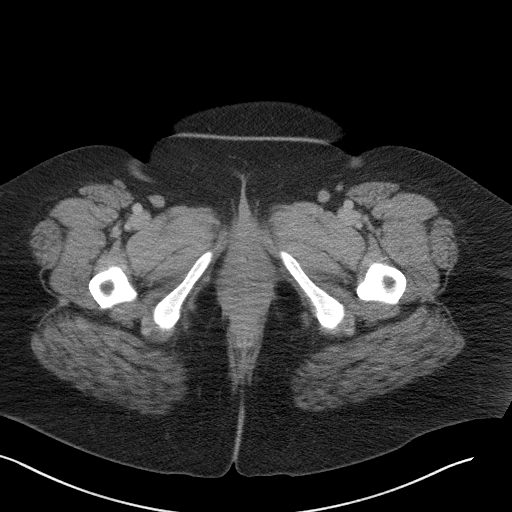
[im 4/91  bone]
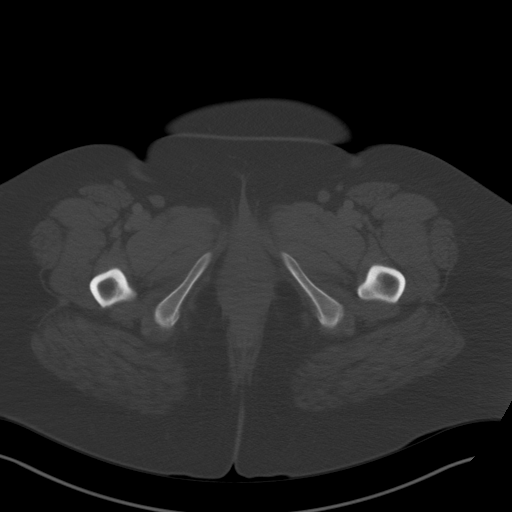
[im 12/91  soft-tissue]
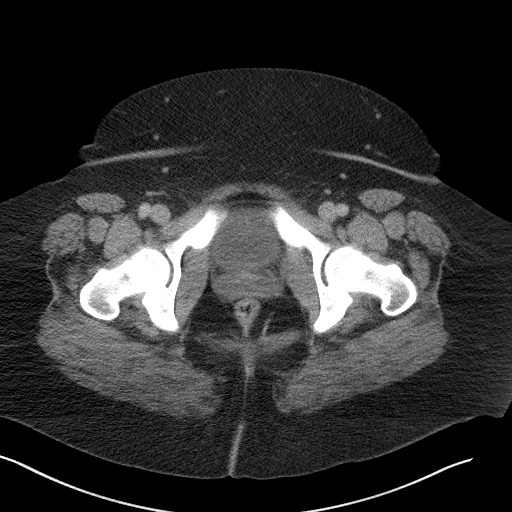
[im 19/91  soft-tissue]
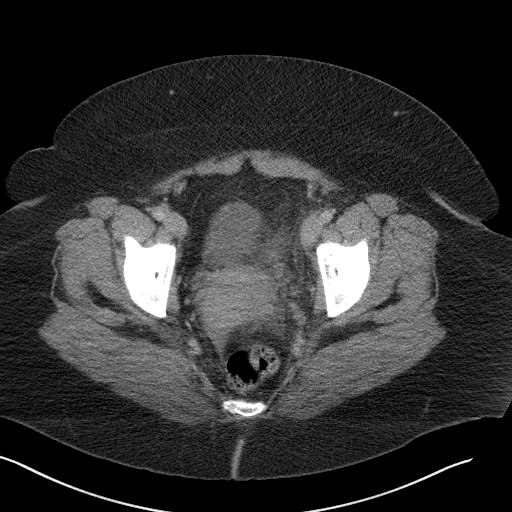
[im 27/91  soft-tissue]
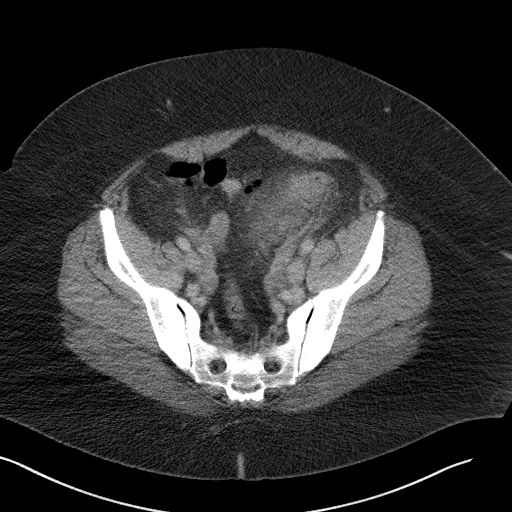
[im 31/91  soft-tissue]
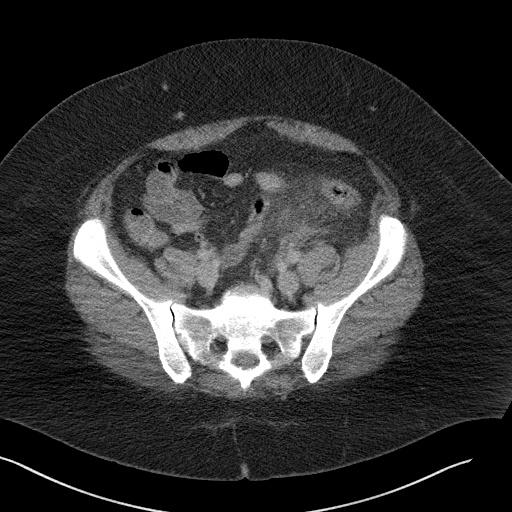
[im 38/91  soft-tissue]
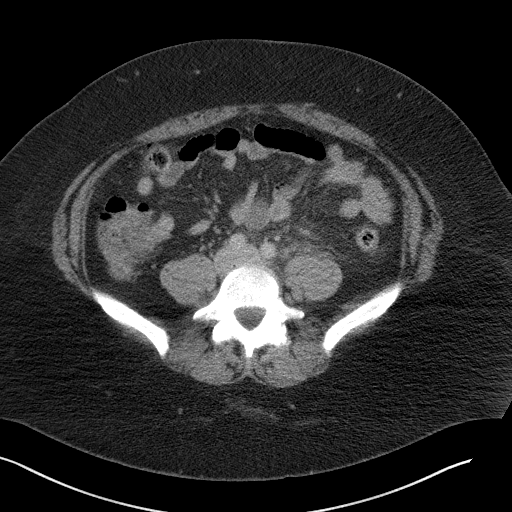
[im 46/91  soft-tissue]
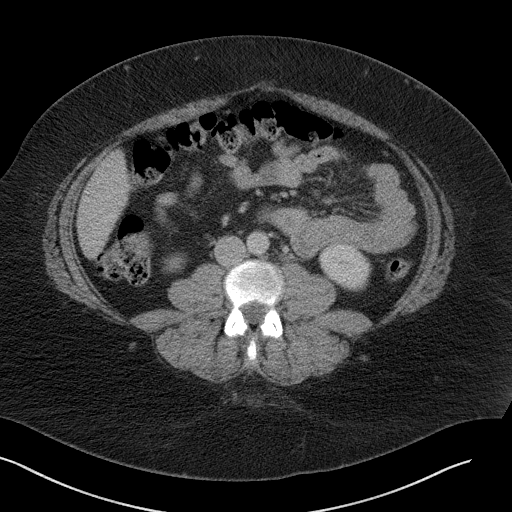
[im 53/91  soft-tissue]
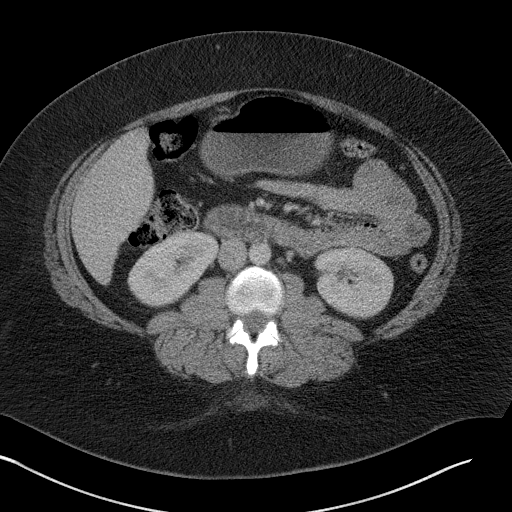
[im 61/91  soft-tissue]
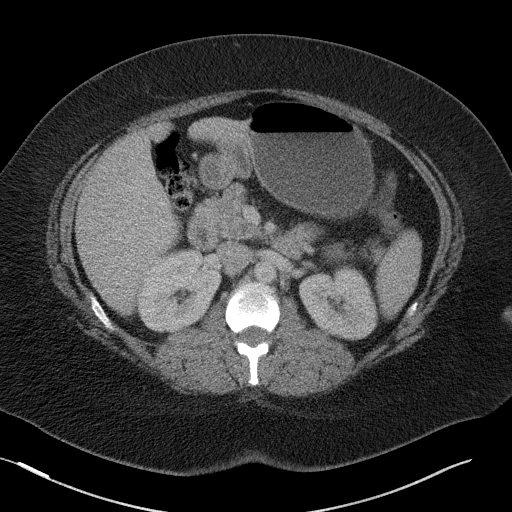
[im 61/91  bone]
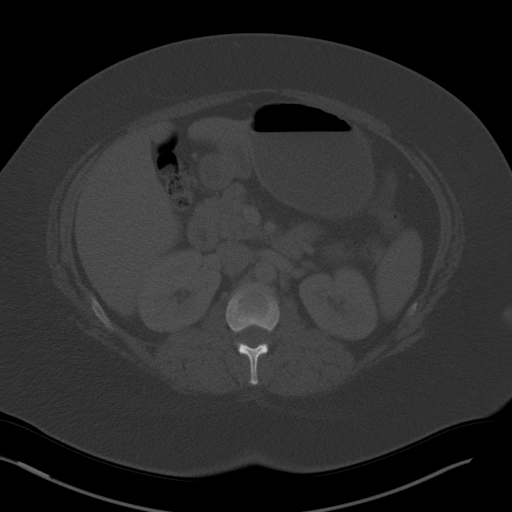
[im 64/91  soft-tissue]
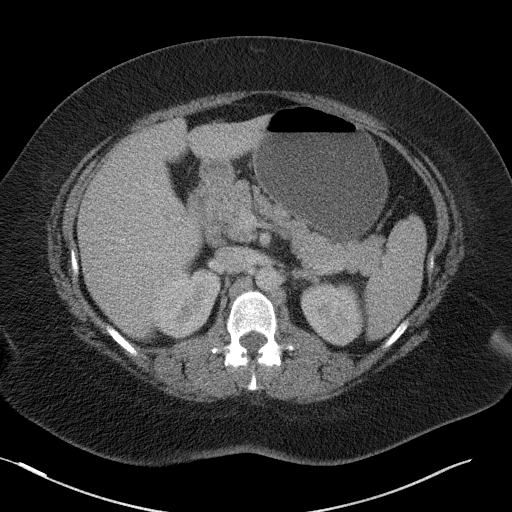
[im 72/91  soft-tissue]
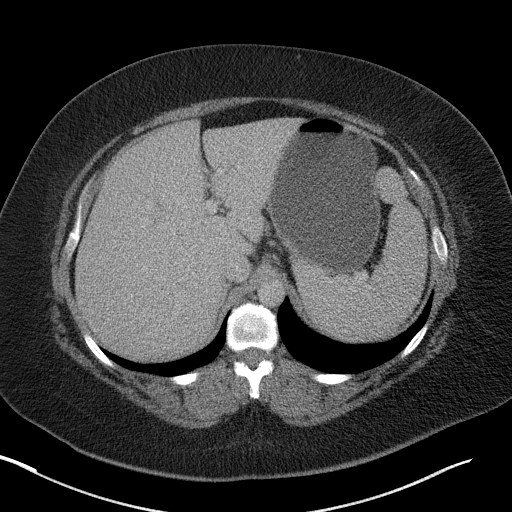
[im 79/91  soft-tissue]
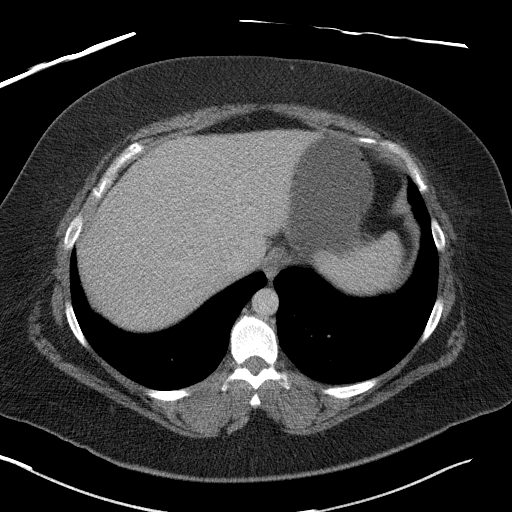
[im 87/91  soft-tissue]
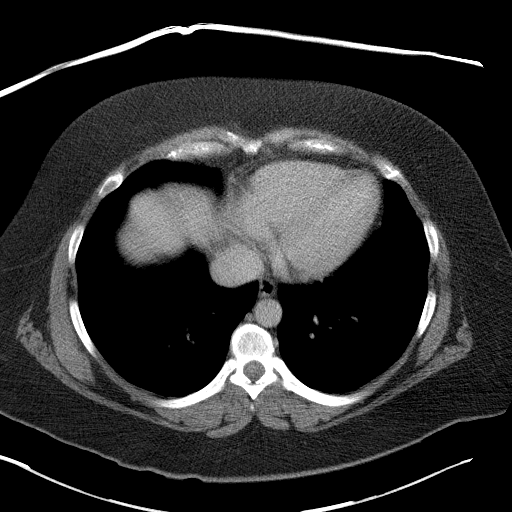

[Series 5: abd/pelvis 3.0 coronal · coronal · 0.81mm/px · 3 of 89 slices shown]
[im 30/89  soft-tissue]
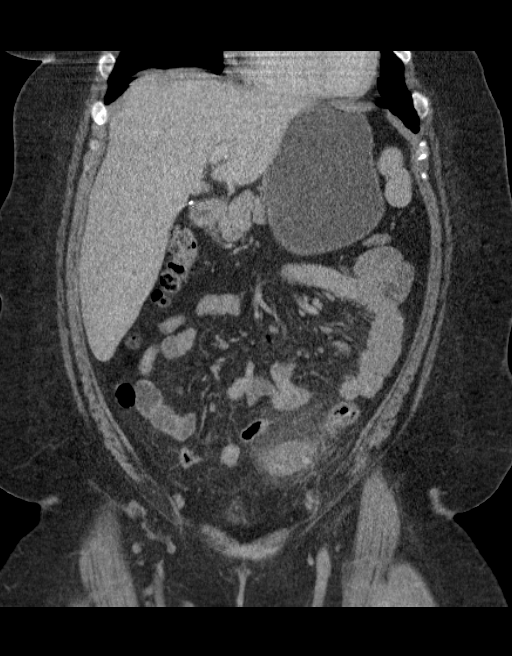
[im 40/89  soft-tissue]
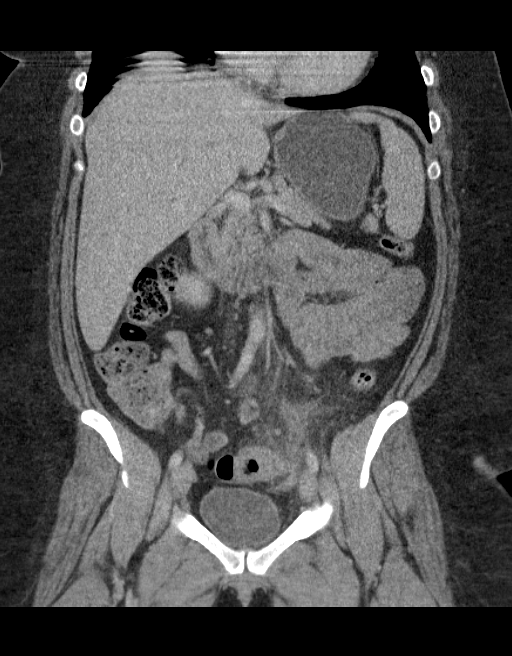
[im 49/89  soft-tissue]
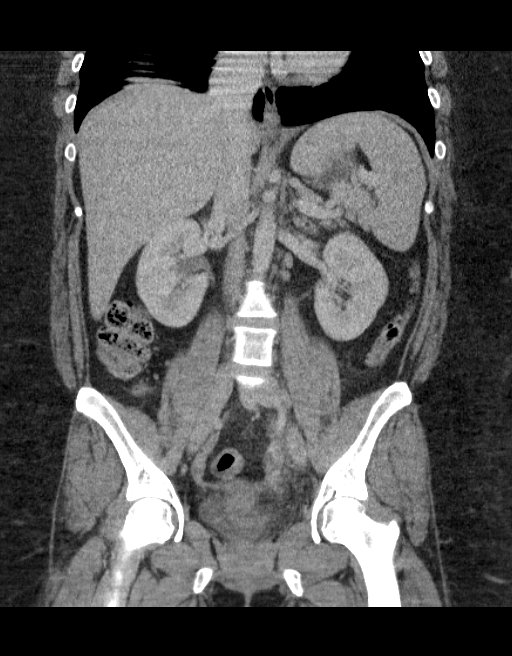

[16 of 46 positions shown; findings below may reference images not displayed]

FINDINGS: The lung bases are clear.  The liver enhances with no
focal abnormality and no ductal dilatation is seen.  Surgical clips
are present in the right upper quadrant from prior cholecystectomy.
The pancreas is normal in size and the pancreatic duct is not
dilated.  The adrenal glands and spleen are unremarkable.  The
stomach is moderately fluid distended and unremarkable.  No
definite renal calculi are seen and there is no evidence of
hydronephrosis.  The abdominal aorta is normal in caliber.

There is thickening of the mucosa of the rectosigmoid colon with
pericolonic strandiness consistent with sigmoid colon
diverticulitis without evidence of complication.  Sigmoid  colon
diverticula are noted.  No abscess is seen.  Only a tiny amount of
free fluid is present in the pelvis.  The uterus is normal in size
and an IUD is present.  No adnexal lesion is noted.  The urinary
bladder is unremarkable.  The appendix is well seen and appears
normal.  The terminal ileum is unremarkable.
IMPRESSION: 1.  Sigmoid colon diverticulitis without complication.
2.  The appendix and terminal ileum appear normal.

## 2012-12-30 ENCOUNTER — Ambulatory Visit (INDEPENDENT_AMBULATORY_CARE_PROVIDER_SITE_OTHER): Payer: Managed Care, Other (non HMO) | Admitting: Emergency Medicine

## 2012-12-30 VITALS — BP 122/78 | HR 83 | Temp 98.0°F | Resp 17 | Ht 63.5 in | Wt 325.0 lb

## 2012-12-30 DIAGNOSIS — S139XXA Sprain of joints and ligaments of unspecified parts of neck, initial encounter: Secondary | ICD-10-CM

## 2012-12-30 MED ORDER — HYDROCODONE-ACETAMINOPHEN 5-325 MG PO TABS: 1.0000 | ORAL_TABLET | ORAL | Status: DC | PRN

## 2012-12-30 MED ORDER — CYCLOBENZAPRINE HCL 10 MG PO TABS: 10.0000 mg | ORAL_TABLET | Freq: Three times a day (TID) | ORAL | Status: DC | PRN

## 2012-12-30 NOTE — Progress Notes (Signed)
[facility] [address], [place] [address] [phone]  Date:  12/30/2012   Name:  Katie [name]   DOB:  [birth year]   MRN:  [id number]  PCP:  Tonye Pearson, MD    Chief Complaint: Neck Pain   History of Present Illness:  Katie [name] is a 37 y.o. very pleasant female patient who presents with the following:  Woke up with neck pain in base of skull middle of night Thursday night.  Pain worsened over the course of time since.  No history of injury or overuse.  No neuro symptoms or radiation of pain.  Seen in urgent care and put on tramadol and mobic with no improvement yesterday and told to follow up here if no improvement.  No improvement with over the counter medications or other home remedies. Denies other complaint or health concern today.   Patient Active Problem List   Diagnosis Date Noted  . Morbid obesity with BMI of 50.0-59.9, adult 05/24/2012  . Gestational diabetes mellitus, class A2 04/15/2011    Past Medical History  Diagnosis Date  . Gestational diabetes   . Morbid obesity   . Carpal tunnel syndrome     only first pregnancy  . Asthma     as a child  . PONV (postoperative nausea and vomiting)   . Sleep apnea     Past Surgical History  Procedure Laterality Date  . Cholecystectomy      2002    History  Substance Use Topics  . Smoking status: Never Smoker   . Smokeless tobacco: Never Used  . Alcohol Use: No    Family History  Problem Relation Age of Onset  . Hypertension Mother   . Thyroid disease Mother   . Migraines Mother   . Diabetes Mother   . Multiple sclerosis Mother   . Diabetes Maternal Grandfather   . Anesthesia problems Neg Hx   . Hypotension Neg Hx   . Malignant hyperthermia Neg Hx   . Pseudochol deficiency Neg Hx     Allergies  Allergen Reactions  . Penicillins Hives    Medication list has been reviewed and updated.  No current outpatient prescriptions on file prior to visit.   No  current facility-administered medications on file prior to visit.    Review of Systems:  As per HPI, otherwise negative.    Physical Examination: Filed Vitals:   12/30/12 1038  BP: 122/78  Pulse: 83  Temp: 98 F (36.7 C)  Resp: 17   Filed Vitals:   12/30/12 1038  Height: 5' 3.5" (1.613 m)  Weight: 325 lb (147.419 kg)   Body mass index is 56.66 kg/(m^2). Ideal Body Weight: Weight in (lb) to have BMI = 25: 143.1   GEN: WDWN, NAD, Non-toxic, Alert & Oriented x 3 HEENT: Atraumatic, Normocephalic.  Ears and Nose: No external deformity. EXTR: No clubbing/cyanosis/edema NEURO: Normal gait.  PSYCH: Normally interactive. Conversant. Not depressed or anxious appearing.  Calm demeanor.  NECK:  Spasm in trapezius. Tender base of skull.  Neuro grossly intact guards rotation and flexion.  Assessment and Plan: Cervical strain. Continue mobic Discontinue tramadol vicodin Flexeril Local heat  Follow up as needed   Signed,  Phillips Odor, MD

## 2012-12-30 NOTE — Patient Instructions (Addendum)

## 2014-03-14 ENCOUNTER — Telehealth: Payer: Self-pay

## 2014-03-14 NOTE — Telephone Encounter (Signed)
Pt called to see whether we had received [facility] her upcoming knee surgery with Dr. Ranell PatrickNorris. They are waiting on approval from Dr. Merla Richesoolittle before they can proceed with the knee surgery. Can we please call the pt to inform her of the status of this?

## 2014-03-15 NOTE — Telephone Encounter (Signed)
Dr. Merla Richesoolittle, have you received this?

## 2014-03-17 ENCOUNTER — Ambulatory Visit (INDEPENDENT_AMBULATORY_CARE_PROVIDER_SITE_OTHER): Payer: BC Managed Care – PPO | Admitting: Internal Medicine

## 2014-03-17 VITALS — BP 150/100 | HR 68 | Temp 98.7°F | Resp 18 | Ht 63.0 in | Wt 326.6 lb

## 2014-03-17 DIAGNOSIS — G8929 Other chronic pain: Secondary | ICD-10-CM

## 2014-03-17 DIAGNOSIS — M25561 Pain in right knee: Secondary | ICD-10-CM

## 2014-03-17 DIAGNOSIS — G4733 Obstructive sleep apnea (adult) (pediatric): Secondary | ICD-10-CM

## 2014-03-17 DIAGNOSIS — Z6841 Body Mass Index (BMI) 40.0 and over, adult: Secondary | ICD-10-CM

## 2014-03-17 NOTE — Telephone Encounter (Signed)
i last saw her 09/2011

## 2014-03-17 NOTE — Progress Notes (Signed)
Subjective:  This chart was scribed for Katie Pearsonobert P Lamere Lightner, MD by Katie [name], ED Scribe. The patient was seen in room 14. Patient's care was started at 7:45 PM.   Patient ID: Katie [name], female    DOB: 05-12-1976, 38 y.o.   MRN: [id number]  Chief Complaint  Patient presents with  . surgical clearance   HPI HPI Comments: Katie [name] is a 38 y.o. female, with a h/o gestational DM and sleep apnea, who presents to the [facility] for surgical clearance. Pt reports that she has a floating fragment in her knee and a spur. Pt states that she has not been able to lose weight lately due to knee pain. Pt has been using her cpap at night. She denies any sleep disturbances at this time. Pt states that she saw Katie [name]( for CBT to control eating) with the [facility] but she stopped going to her when Katie [name] switched to part-time. Was helpful. Eats to control her psychological symptoms.  Leg Swelling Pt reports that bilateral leg swelling worsens as the day progresses. She has noticed that leg swelling decreases with reducing sugar intake.  Past Medical History  Diagnosis Date  . Gestational diabetes   . Morbid obesity   . Carpal tunnel syndrome     only first pregnancy  . Asthma     as a child  . PONV (postoperative nausea and vomiting)   . Sleep apnea    Current Outpatient Prescriptions on File Prior to Visit  Medication Sig Dispense Refill  . cyclobenzaprine (FLEXERIL) 10 MG tablet Take 1 tablet (10 mg total) by mouth 3 (three) times daily as needed for muscle spasms. 30 tablet 0  . HYDROcodone-acetaminophen (NORCO) 5-325 MG per tablet Take 1-2 tablets by mouth every 4 (four) hours as needed for pain. 30 tablet 0  . meloxicam (MOBIC) 15 MG tablet Take 15 mg by mouth daily.     No current facility-administered medications on file prior to visit.   Allergies  Allergen Reactions  . Penicillins Hives   Review of  Systems  Cardiovascular: Positive for leg swelling.  Musculoskeletal: Positive for arthralgias.  Psychiatric/Behavioral: Negative for sleep disturbance.  no chest pain or palpitations No dyspnea on exertion No headaches No GI or GU problems    Objective:   Physical Exam  Constitutional: She is oriented to person, place, and time. She appears well-developed and well-nourished. No distress.  HENT:  Head: Normocephalic and atraumatic.  Eyes: Conjunctivae and EOM are normal. Pupils are equal, round, and reactive to light.  Neck: Neck supple. No thyroid mass and no thyromegaly present.  Cardiovascular: Normal rate, regular rhythm and normal heart sounds.   No murmur heard. Pulmonary/Chest: Effort normal and breath sounds normal. No respiratory distress.  Musculoskeletal: Normal range of motion. She exhibits edema.  Severe dependent edema bilaterally LE without obvious changes in blood flow  Neurological: She is alert and oriented to person, place, and time. No cranial nerve deficit.  Skin: Skin is warm and dry.  Psychiatric: She has a normal mood and affect. Her behavior is normal.  Nursing note and vitals reviewed.  Triage Vitals: BP 150/100 mmHg(all prior blood pressures have been normal over the past 2 years at this office)  Pulse 68  Temp(Src) 98.7 F (37.1 C) (Oral)  Resp 18  Ht 5\' 3"  (1.6 m)  Wt 326 lb 9.6 oz (148.145 kg)  BMI 57.87 kg/m2  SpO2 100%  LMP 03/17/2014  Assessment & Plan:   I personally performed the services described in this documentation, which was scribed in my presence. The recorded information has been reviewed and is accurate.  Morbid obesity with BMI of 50.0-59.9, adult  OSA (obstructive sleep apnea)  Knee pain, chronic, right  There is no reason that she should not be cleared for surgery--notes sent to Katie [name] Names for cbt given

## 2014-03-18 NOTE — Telephone Encounter (Signed)
MR, can we see if we have received this fax please. Thanks

## 2014-03-18 NOTE — Telephone Encounter (Signed)
Pulled signed form from Batch box and put on Clinical TL Keyboard.

## 2014-03-19 HISTORY — PX: KNEE SURGERY: SHX244

## 2014-03-24 NOTE — Telephone Encounter (Signed)
Pt saw Dr. Merla Richesoolittle and got this taken care of.

## 2014-05-27 ENCOUNTER — Ambulatory Visit (INDEPENDENT_AMBULATORY_CARE_PROVIDER_SITE_OTHER): Payer: BLUE CROSS/BLUE SHIELD | Admitting: Licensed Clinical Social Worker

## 2014-05-27 DIAGNOSIS — F419 Anxiety disorder, unspecified: Secondary | ICD-10-CM

## 2014-06-05 ENCOUNTER — Ambulatory Visit (INDEPENDENT_AMBULATORY_CARE_PROVIDER_SITE_OTHER): Payer: BLUE CROSS/BLUE SHIELD | Admitting: Licensed Clinical Social Worker

## 2014-06-05 DIAGNOSIS — F419 Anxiety disorder, unspecified: Secondary | ICD-10-CM

## 2014-06-17 ENCOUNTER — Ambulatory Visit (INDEPENDENT_AMBULATORY_CARE_PROVIDER_SITE_OTHER): Payer: BLUE CROSS/BLUE SHIELD | Admitting: Licensed Clinical Social Worker

## 2014-06-17 DIAGNOSIS — F411 Generalized anxiety disorder: Secondary | ICD-10-CM

## 2014-06-24 ENCOUNTER — Ambulatory Visit: Payer: BLUE CROSS/BLUE SHIELD | Admitting: Licensed Clinical Social Worker

## 2014-06-26 ENCOUNTER — Ambulatory Visit (INDEPENDENT_AMBULATORY_CARE_PROVIDER_SITE_OTHER): Payer: BLUE CROSS/BLUE SHIELD | Admitting: Licensed Clinical Social Worker

## 2014-06-26 DIAGNOSIS — F411 Generalized anxiety disorder: Secondary | ICD-10-CM

## 2014-07-08 ENCOUNTER — Ambulatory Visit: Payer: BLUE CROSS/BLUE SHIELD | Admitting: Licensed Clinical Social Worker

## 2014-09-13 ENCOUNTER — Ambulatory Visit (INDEPENDENT_AMBULATORY_CARE_PROVIDER_SITE_OTHER): Payer: BLUE CROSS/BLUE SHIELD

## 2014-09-13 ENCOUNTER — Ambulatory Visit (INDEPENDENT_AMBULATORY_CARE_PROVIDER_SITE_OTHER): Payer: BLUE CROSS/BLUE SHIELD | Admitting: Family Medicine

## 2014-09-13 VITALS — BP 130/80 | HR 85 | Temp 98.1°F | Ht 63.5 in | Wt 335.0 lb

## 2014-09-13 DIAGNOSIS — S93601A Unspecified sprain of right foot, initial encounter: Secondary | ICD-10-CM

## 2014-09-13 DIAGNOSIS — S99921A Unspecified injury of right foot, initial encounter: Secondary | ICD-10-CM

## 2014-09-13 DIAGNOSIS — Z3009 Encounter for other general counseling and advice on contraception: Secondary | ICD-10-CM

## 2014-09-13 LAB — POCT URINE PREGNANCY: PREG TEST UR: NEGATIVE

## 2014-09-13 MED ORDER — HYDROCODONE-ACETAMINOPHEN 5-325 MG PO TABS: 1.0000 | ORAL_TABLET | Freq: Four times a day (QID) | ORAL | Status: DC | PRN

## 2014-09-13 MED ORDER — IBUPROFEN 800 MG PO TABS: 800.0000 mg | ORAL_TABLET | Freq: Three times a day (TID) | ORAL | Status: DC | PRN

## 2014-09-13 NOTE — Progress Notes (Addendum)
Subjective:    Patient ID: Katie [name], female    DOB: 1976-03-05, 39 y.o.   MRN: [id number] This chart was scribed for Sherren MochaEva N Moksh Loomer, MD by [place] Peace, ED Scribe. The patient was seen in RM02. The patient's care was started at 9:44 AM.  Chief Complaint  Patient presents with  . Foot Injury    C/O right foot pain since a fall yesterday    Foot Injury    HPI Comments: Katie [name] is a 39 y.o. female who [facility] complaining of right foot injury that occurred yesterday when pt was walking and tripped, causing her to fall forward in which she was able to catch herself but land on her left foot. Pt reports pain at rest pt is over 5th metatarsal but pain with walking is over plantar aspect of MTP that radiates over the ball of her foot. She further reports she is experiencing extreme pressure in affected area that she explains she has never experienced before. She notes some swelling to left foot as well but states she normally has swelling to her feet after being up all day. Pt adds that she does have some tramadol at home that was prescribed to her following a right knee surgery she had performed dating back to November. History of Gestational DM and morbid obesity. Pt is non-smoker.    Past Medical History  Diagnosis Date  . Gestational diabetes   . Morbid obesity   . Carpal tunnel syndrome     only first pregnancy  . Asthma     as a child  . PONV (postoperative nausea and vomiting)   . Sleep apnea    Allergies  Allergen Reactions  . Penicillins Hives  ' Current Outpatient Prescriptions on File Prior to Visit  Medication Sig Dispense Refill  . metFORMIN (GLUCOPHAGE) 500 MG tablet   1   No current facility-administered medications on file prior to visit.   Filed Vitals:   09/13/14 0851  BP: 130/80  Pulse: 85  Temp: 98.1 F (36.7 C)      Review of Systems  Musculoskeletal: Positive for arthralgias.       Right foot pain with swelling and  tenderness.        Objective:   Physical Exam  Constitutional: She is oriented to person, place, and time. She appears well-developed and well-nourished. No distress.  HENT:  Head: Normocephalic and atraumatic.  Eyes: Conjunctivae and EOM are normal.  Neck: Neck supple. No tracheal deviation present.  Cardiovascular: Normal rate.   Pulmonary/Chest: Effort normal. No respiratory distress.  Musculoskeletal: Normal range of motion. She exhibits edema and tenderness.  No point tenderness over malleoli or 5th and 1st metatarsal. Pain over 5th MTP with forced plantar flexion. No pain with forced dorsiflexion. Strength 5/5.  Neurological: She is alert and oriented to person, place, and time.  Skin: Skin is warm and dry.  Psychiatric: She has a normal mood and affect. Her behavior is normal.  Nursing note and vitals reviewed.   9:48 AM- Treatment plan was discussed with patient who verbalizes understanding and agrees.     [facility] reading (PRIMARY) by  Dr. Clelia CroftShaw. Rt foot: no acute abnormality Assessment & Plan:   1. Foot injury, right, initial encounter   2. Family planning   3. Foot sprain, right, initial encounter   post-op shoe, then transition to tennis shoe with firm sole and wide toe box. Pt has crutches to help ambulate as needed.  RICE then  gradually return to normal activity. If worsening or still w/ sxs in 1-2 wks, RTC for reheck.  Orders Placed This Encounter  Procedures  . DG Foot Complete Right    Standing Status: Future     Number of Occurrences: 1     Standing Expiration Date: 09/15/2014    Order Specific Question:  Reason for Exam (SYMPTOM  OR DIAGNOSIS REQUIRED)    Answer:  pain over ball of foot when ambulating after injury yest    Order Specific Question:  Is the patient pregnant?    Answer:  No    Order Specific Question:  Preferred imaging location?    Answer:  External  . POCT urine pregnancy    Meds ordered this encounter  Medications  . ibuprofen  (ADVIL,MOTRIN) 800 MG tablet    Sig: Take 1 tablet (800 mg total) by mouth every 8 (eight) hours as needed.    Dispense:  30 tablet    Refill:  0  . HYDROcodone-acetaminophen (NORCO/VICODIN) 5-325 MG per tablet    Sig: Take 1 tablet by mouth every 6 (six) hours as needed for moderate pain.    Dispense:  20 tablet    Refill:  0    I personally performed the services described in this documentation, which was scribed in my presence. The recorded information has been reviewed and considered, and addended by me as needed.  Norberto Sorenson, MD MPH

## 2014-09-13 NOTE — Patient Instructions (Addendum)
Ice 20 minutes qid, elevated whenever sitting, weight-bare only as tolerated. Wear shoe at all times.  If you are not getting signficiant improvement in 1-2 weeks, please come back so we can see if we need to get further imaging or consider specialty referral.  Foot Sprain The muscles and cord like structures which attach muscle to bone (tendons) that surround the feet are made up of units. A foot sprain can occur at the weakest spot in any of these units. This condition is most often caused by injury to or overuse of the foot, as from playing contact sports, or aggravating a previous injury, or from poor conditioning, or obesity. SYMPTOMS  Pain with movement of the foot.  Tenderness and swelling at the injury site.  Loss of strength is present in moderate or severe sprains. THE THREE GRADES OR SEVERITY OF FOOT SPRAIN ARE:  Mild (Grade I): Slightly pulled muscle without tearing of muscle or tendon fibers or loss of strength.  Moderate (Grade II): Tearing of fibers in a muscle, tendon, or at the attachment to bone, with small decrease in strength.  Severe (Grade III): Rupture of the muscle-tendon-bone attachment, with separation of fibers. Severe sprain requires surgical repair. Often repeating (chronic) sprains are caused by overuse. Sudden (acute) sprains are caused by direct injury or over-use. DIAGNOSIS  Diagnosis of this condition is usually by your own observation. If problems continue, a caregiver may be required for further evaluation and treatment. X-rays may be required to make sure there are not breaks in the bones (fractures) present. Continued problems may require physical therapy for treatment. PREVENTION  Use strength and conditioning exercises appropriate for your sport.  Warm up properly prior to working out.  Use athletic shoes that are made for the sport you are participating in.  Allow adequate time for healing. Early return to activities makes repeat injury more  likely, and can lead to an unstable arthritic foot that can result in prolonged disability. Mild sprains generally heal in 3 to 10 days, with moderate and severe sprains taking 2 to 10 weeks. Your caregiver can help you determine the proper time required for healing. HOME CARE INSTRUCTIONS   Apply ice to the injury for 15-20 minutes, 03-04 times per day. Put the ice in a plastic bag and place a towel between the bag of ice and your skin.  An elastic wrap (like an Ace bandage) may be used to keep swelling down.  Keep foot above the level of the heart, or at least raised on a footstool, when swelling and pain are present.  Try to avoid use other than gentle range of motion while the foot is painful. Do not resume use until instructed by your caregiver. Then begin use gradually, not increasing use to the point of pain. If pain does develop, decrease use and continue the above measures, gradually increasing activities that do not cause discomfort, until you gradually achieve normal use.  Use crutches if and as instructed, and for the length of time instructed.  Keep injured foot and ankle wrapped between treatments.  Massage foot and ankle for comfort and to keep swelling down. Massage from the toes up towards the knee.  Only take over-the-counter or prescription medicines for pain, discomfort, or fever as directed by your caregiver. SEEK IMMEDIATE MEDICAL CARE IF:   Your pain and swelling increase, or pain is not controlled with medications.  You have loss of feeling in your foot or your foot turns cold or blue.  You  develop new, unexplained symptoms, or an increase of the symptoms that brought you to your caregiver. MAKE SURE YOU:   Understand these instructions.  Will watch your condition.  Will get help right away if you are not doing well or get worse. Document Released: 10/22/2001 Document Revised: 07/25/2011 Document Reviewed: 12/20/2007 [place] [employer] Patient Information 2015  [place], [place]. This information is not intended to replace advice given to you by your health care provider. Make sure you discuss any questions you have with your health care provider.

## 2014-09-15 ENCOUNTER — Telehealth: Payer: Self-pay

## 2014-09-15 NOTE — Telephone Encounter (Signed)
Patient is requesting copy of x-ray for right foot taken April 30.   [phone]

## 2014-09-15 NOTE — Telephone Encounter (Signed)
Copy of message sent to Xray. 

## 2015-04-13 ENCOUNTER — Encounter: Payer: Self-pay | Admitting: Internal Medicine

## 2015-09-08 LAB — OB RESULTS CONSOLE ABO/RH: RH TYPE: POSITIVE

## 2015-09-08 LAB — OB RESULTS CONSOLE HIV ANTIBODY (ROUTINE TESTING): HIV: NONREACTIVE

## 2015-09-08 LAB — OB RESULTS CONSOLE RUBELLA ANTIBODY, IGM: RUBELLA: IMMUNE

## 2015-09-08 LAB — OB RESULTS CONSOLE HEPATITIS B SURFACE ANTIGEN: HEP B S AG: NEGATIVE

## 2015-09-08 LAB — OB RESULTS CONSOLE GC/CHLAMYDIA
Chlamydia: NEGATIVE
Gonorrhea: NEGATIVE

## 2015-09-08 LAB — OB RESULTS CONSOLE RPR: RPR: NONREACTIVE

## 2015-09-08 LAB — OB RESULTS CONSOLE ANTIBODY SCREEN: Antibody Screen: NEGATIVE

## 2015-12-07 ENCOUNTER — Encounter: Payer: BLUE CROSS/BLUE SHIELD | Attending: Obstetrics and Gynecology | Admitting: Skilled Nursing Facility1

## 2015-12-07 DIAGNOSIS — O99810 Abnormal glucose complicating pregnancy: Secondary | ICD-10-CM | POA: Diagnosis present

## 2015-12-07 DIAGNOSIS — O24419 Gestational diabetes mellitus in pregnancy, unspecified control: Secondary | ICD-10-CM

## 2015-12-08 ENCOUNTER — Encounter: Payer: Self-pay | Admitting: Skilled Nursing Facility1

## 2015-12-08 NOTE — Progress Notes (Signed)
  Patient was seen on 12/07/2015 for Gestational Diabetes self-management class at the [facility]. The following learning objectives were met by the patient during this course:   States the definition of Gestational Diabetes  States why dietary management is important in controlling blood glucose  Describes the effects each nutrient has on blood glucose levels  Demonstrates ability to create a balanced meal plan  Demonstrates carbohydrate counting   States when to check blood glucose levels involving a total of 4 separate occurences in a day  Demonstrates proper blood glucose monitoring techniques  States the effect of stress and exercise on blood glucose levels  States the importance of limiting caffeine and abstaining from alcohol and smoking  Demonstrates the knowledge the glucometer provided in class may not be covered by their insurance and to call their insurance provider immediately after class to know which glucometer their insurance provider does cover as well as calling their physician the next day for a prescription to the glucometer their insurance does cover (if the one provided is not) as well as the lancets and strips for that meter.  Blood glucose monitor given: Pt had her own and was already checking  Patient instructed to monitor glucose levels: FBS: 60 - <90 1 hour: <140 2 hour: <120  *Patient received handouts:  Nutrition Diabetes and Pregnancy  Carbohydrate Counting List  Patient will be seen for follow-up as needed.

## 2016-01-11 ENCOUNTER — Encounter: Payer: Self-pay | Admitting: Obstetrics and Gynecology

## 2016-01-12 ENCOUNTER — Encounter: Payer: Self-pay | Admitting: Obstetrics and Gynecology

## 2016-02-19 ENCOUNTER — Other Ambulatory Visit (HOSPITAL_COMMUNITY): Payer: Self-pay | Admitting: Obstetrics and Gynecology

## 2016-02-19 DIAGNOSIS — Z3A33 33 weeks gestation of pregnancy: Secondary | ICD-10-CM

## 2016-02-19 DIAGNOSIS — Z3689 Encounter for other specified antenatal screening: Secondary | ICD-10-CM

## 2016-02-25 ENCOUNTER — Encounter (HOSPITAL_COMMUNITY): Payer: Self-pay | Admitting: *Deleted

## 2016-02-26 ENCOUNTER — Encounter (HOSPITAL_COMMUNITY): Payer: Self-pay

## 2016-02-26 ENCOUNTER — Ambulatory Visit (HOSPITAL_COMMUNITY)
Admission: RE | Admit: 2016-02-26 | Discharge: 2016-02-26 | Disposition: A | Discharge status: Home / Self Care | Payer: BLUE CROSS/BLUE SHIELD | Source: Ambulatory Visit | Attending: Obstetrics and Gynecology | Admitting: Obstetrics and Gynecology

## 2016-02-26 DIAGNOSIS — O99513 Diseases of the respiratory system complicating pregnancy, third trimester: Secondary | ICD-10-CM | POA: Diagnosis not present

## 2016-02-26 DIAGNOSIS — O99213 Obesity complicating pregnancy, third trimester: Secondary | ICD-10-CM | POA: Insufficient documentation

## 2016-02-26 DIAGNOSIS — J45909 Unspecified asthma, uncomplicated: Secondary | ICD-10-CM | POA: Insufficient documentation

## 2016-02-26 DIAGNOSIS — Z3A33 33 weeks gestation of pregnancy: Secondary | ICD-10-CM | POA: Diagnosis not present

## 2016-02-26 DIAGNOSIS — Z3689 Encounter for other specified antenatal screening: Secondary | ICD-10-CM

## 2016-03-23 ENCOUNTER — Encounter (HOSPITAL_COMMUNITY): Payer: Self-pay

## 2016-03-23 ENCOUNTER — Inpatient Hospital Stay (HOSPITAL_COMMUNITY)
Admission: AD | Admit: 2016-03-23 | Discharge: 2016-03-23 | Disposition: A | Discharge status: Home / Self Care | Payer: BLUE CROSS/BLUE SHIELD | Source: Ambulatory Visit | Attending: Obstetrics and Gynecology | Admitting: Obstetrics and Gynecology

## 2016-03-23 DIAGNOSIS — Z3A36 36 weeks gestation of pregnancy: Secondary | ICD-10-CM | POA: Insufficient documentation

## 2016-03-23 DIAGNOSIS — O24415 Gestational diabetes mellitus in pregnancy, controlled by oral hypoglycemic drugs: Secondary | ICD-10-CM | POA: Diagnosis not present

## 2016-03-23 DIAGNOSIS — Y929 Unspecified place or not applicable: Secondary | ICD-10-CM | POA: Insufficient documentation

## 2016-03-23 DIAGNOSIS — Z880 Allergy status to penicillin: Secondary | ICD-10-CM | POA: Insufficient documentation

## 2016-03-23 DIAGNOSIS — Z7984 Long term (current) use of oral hypoglycemic drugs: Secondary | ICD-10-CM | POA: Diagnosis not present

## 2016-03-23 DIAGNOSIS — W109XXA Fall (on) (from) unspecified stairs and steps, initial encounter: Secondary | ICD-10-CM | POA: Diagnosis not present

## 2016-03-23 DIAGNOSIS — W108XXA Fall (on) (from) other stairs and steps, initial encounter: Secondary | ICD-10-CM | POA: Diagnosis present

## 2016-03-23 DIAGNOSIS — O26893 Other specified pregnancy related conditions, third trimester: Secondary | ICD-10-CM | POA: Diagnosis not present

## 2016-03-23 DIAGNOSIS — O403XX0 Polyhydramnios, third trimester, not applicable or unspecified: Secondary | ICD-10-CM | POA: Diagnosis not present

## 2016-03-23 LAB — GLUCOSE, CAPILLARY: GLUCOSE-CAPILLARY: 93 mg/dL (ref 65–99)

## 2016-03-23 NOTE — MAU Note (Signed)
Pt fell down stairs today and hit her left side. Sent for monitoring.

## 2016-03-23 NOTE — MAU Provider Note (Signed)
  History     CSN: [id number]  Arrival date and time: 03/23/16 1043 No call to provider  First Provider Initiated Contact with Patient 03/23/16 1105      Chief Complaint  Patient presents with  . Fall   HPI  Reports fell coming down stairs this AM - initially missed last stair but caught self then tripped with momentum of fall - fell thru gate landing on left arm/shoulder/hip.  Denies any contractions, cramping, bleeding, leakage of fluid, pain.  No visible trauma from fall  Past Medical History:  Diagnosis Date  . Asthma    as a child  . Carpal tunnel syndrome    only first pregnancy  . Gestational diabetes   . Morbid obesity (HCC)   . PONV (postoperative nausea and vomiting)   . Sleep apnea     Past Surgical History:  Procedure Laterality Date  . CHOLECYSTECTOMY     2002  . KNEE SURGERY Right 03/19/2014   orthoscopic arthroscopy  . TONSILLECTOMY AND ADENOIDECTOMY      Family History  Problem Relation Age of Onset  . Hypertension Mother   . Thyroid disease Mother   . Migraines Mother   . Diabetes Mother   . Multiple sclerosis Mother   . Diabetes Maternal Grandfather   . Anesthesia problems Neg Hx   . Hypotension Neg Hx   . Malignant hyperthermia Neg Hx   . Pseudochol deficiency Neg Hx     Social History  Substance Use Topics  . Smoking status: Never Smoker  . Smokeless tobacco: Never Used  . Alcohol use 0.0 oz/week     Comment: occ    Allergies:  Allergies  Allergen Reactions  . Penicillins Hives    Prescriptions Prior to Admission  Medication Sig Dispense Refill Last Dose  . GLYBURIDE PO Take 7.5 mg by mouth at bedtime.   Taking  . HYDROcodone-acetaminophen (NORCO/VICODIN) 5-325 MG per tablet Take 1 tablet by mouth every 6 (six) hours as needed for moderate pain. (Patient not taking: Reported on 02/26/2016) 20 tablet 0 Not Taking  . ibuprofen (ADVIL,MOTRIN) 800 MG tablet Take 1 tablet (800 mg total) by mouth every 8 (eight) hours as needed.  (Patient not taking: Reported on 02/26/2016) 30 tablet 0 Not Taking  . metFORMIN (GLUCOPHAGE) 500 MG tablet   1 Not Taking  . sertraline (ZOLOFT) 100 MG tablet Take 100 mg by mouth at bedtime.   Taking   ROS  Active FM No ctx or cramping or pain No bleeding or spotting  Physical Exam   Blood pressure 130/84, pulse 78, temperature 98 F (36.7 C), resp. rate 16, last menstrual period 06/11/2015.  Physical Exam  Alert and oriented x 3 Abdomen soft and non-tender / soft panus / uterus non-tender Defer VE FHR not graphing continuously - adjusted monitor / need for continuous tracing for interpretation  Toco - no UI or ctx graphing  MAU Course  Procedures NST with prolonged EFM x 4 hours  Assessment and Plan  36.5 weeks s/p fall this AM GDMa2 Polyhydramnios  1) NST then prolonged continous EFM x 4 hours 2) Carb modified diet with CBG monitoring 3) re-evaluate status in 4 hours  Aliana Kreischer 03/23/2016, 11:06 AM

## 2016-04-01 ENCOUNTER — Other Ambulatory Visit: Payer: Self-pay | Admitting: Obstetrics and Gynecology

## 2016-04-04 ENCOUNTER — Encounter (HOSPITAL_COMMUNITY): Payer: Self-pay

## 2016-04-04 ENCOUNTER — Other Ambulatory Visit: Payer: Self-pay | Admitting: Obstetrics and Gynecology

## 2016-04-06 ENCOUNTER — Encounter (HOSPITAL_COMMUNITY)
Admission: RE | Admit: 2016-04-06 | Discharge: 2016-04-06 | Disposition: A | Discharge status: Home / Self Care | Payer: BLUE CROSS/BLUE SHIELD | Source: Ambulatory Visit | Attending: Obstetrics and Gynecology | Admitting: Obstetrics and Gynecology

## 2016-04-06 HISTORY — DX: Supervision of elderly multigravida, unspecified trimester: O09.529

## 2016-04-06 HISTORY — DX: Anxiety disorder, unspecified: F41.9

## 2016-04-06 LAB — BASIC METABOLIC PANEL
ANION GAP: 7 (ref 5–15)
BUN: 16 mg/dL (ref 6–20)
CALCIUM: 9.1 mg/dL (ref 8.9–10.3)
CO2: 23 mmol/L (ref 22–32)
CREATININE: 0.59 mg/dL (ref 0.44–1.00)
Chloride: 106 mmol/L (ref 101–111)
GFR calc non Af Amer: >60 mL/min (ref >60)
Glucose, Bld: 113 mg/dL — ABNORMAL HIGH (ref 65–99)
Potassium: 4.7 mmol/L (ref 3.5–5.1)
SODIUM: 136 mmol/L (ref 135–145)

## 2016-04-06 LAB — CBC
HCT: 33 % — ABNORMAL LOW (ref 36.0–46.0)
Hemoglobin: 10.9 g/dL — ABNORMAL LOW (ref 12.0–15.0)
MCH: 26.2 pg (ref 26.0–34.0)
MCHC: 33 g/dL (ref 30.0–36.0)
MCV: 79.3 fL (ref 78.0–100.0)
PLATELETS: 251 10*3/uL (ref 150–400)
RBC: 4.16 MIL/uL (ref 3.87–5.11)
RDW: 16.3 % — AB (ref 11.5–15.5)
WBC: 9.3 10*3/uL (ref 4.0–10.5)

## 2016-04-06 LAB — ABO/RH: ABO/RH(D): B POS

## 2016-04-06 NOTE — Patient Instructions (Signed)
20 Katie LollKristina Franchi  04/06/2016   Your procedure is scheduled on: 04/08/2016  [facility] AM.  Pick up the phone at the desk and dial [phone].   Call this number if you have problems the morning of surgery: [phone]   Remember:   Do not eat food:After Midnight.  Do not drink clear liquids: After Midnight.  Take these medicines the morning of surgery with A SIP OF WATER: do not take glyburide in the morning   Do not wear jewelry, make-up or nail polish.  Do not wear lotions, powders, or perfumes. Do not wear deodorant.  Do not shave 48 hours prior to surgery.  Do not bring valuables to the hospital.  [facility] not   responsible for any belongings or valuables brought to the hospital.  Contacts, dentures or bridgework may not be worn into surgery.  Leave suitcase in the car. After surgery it may be brought to your room.  For patients admitted to the hospital, checkout time is 11:00 AM the day of              discharge.   Patients discharged the day of surgery will not be allowed to drive             home.  Name and phone number of your driver: na  Special Instructions:   N/A   Please read over the following fact sheets that you were given:   Surgical Site Infection Prevention

## 2016-04-07 LAB — RPR: RPR: NONREACTIVE

## 2016-04-07 MED ORDER — GENTAMICIN SULFATE 40 MG/ML IJ SOLN
INTRAVENOUS | Status: DC
  Filled 2016-04-07: qty 12.25

## 2016-04-08 ENCOUNTER — Encounter (HOSPITAL_COMMUNITY): Payer: Self-pay

## 2016-04-08 ENCOUNTER — Inpatient Hospital Stay (HOSPITAL_COMMUNITY): Payer: BLUE CROSS/BLUE SHIELD | Admitting: Anesthesiology

## 2016-04-08 ENCOUNTER — Encounter (HOSPITAL_COMMUNITY)
Admission: RE | Disposition: A | Discharge status: Home / Self Care | Payer: Self-pay | Source: Ambulatory Visit | Attending: Obstetrics and Gynecology

## 2016-04-08 ENCOUNTER — Inpatient Hospital Stay (HOSPITAL_COMMUNITY)
Admission: RE | Admit: 2016-04-08 | Discharge: 2016-04-11 | DRG: 765 | Disposition: A | Discharge status: Home / Self Care | Payer: BLUE CROSS/BLUE SHIELD | Source: Ambulatory Visit | Attending: Obstetrics and Gynecology | Admitting: Obstetrics and Gynecology

## 2016-04-08 DIAGNOSIS — D509 Iron deficiency anemia, unspecified: Secondary | ICD-10-CM | POA: Diagnosis present

## 2016-04-08 DIAGNOSIS — L259 Unspecified contact dermatitis, unspecified cause: Secondary | ICD-10-CM | POA: Diagnosis not present

## 2016-04-08 DIAGNOSIS — L2389 Allergic contact dermatitis due to other agents: Secondary | ICD-10-CM | POA: Diagnosis present

## 2016-04-08 DIAGNOSIS — D62 Acute posthemorrhagic anemia: Secondary | ICD-10-CM | POA: Diagnosis not present

## 2016-04-08 DIAGNOSIS — O403XX0 Polyhydramnios, third trimester, not applicable or unspecified: Secondary | ICD-10-CM | POA: Diagnosis present

## 2016-04-08 DIAGNOSIS — O99214 Obesity complicating childbirth: Secondary | ICD-10-CM | POA: Diagnosis present

## 2016-04-08 DIAGNOSIS — O322XX0 Maternal care for transverse and oblique lie, not applicable or unspecified: Secondary | ICD-10-CM | POA: Diagnosis present

## 2016-04-08 DIAGNOSIS — O24425 Gestational diabetes mellitus in childbirth, controlled by oral hypoglycemic drugs: Secondary | ICD-10-CM | POA: Diagnosis present

## 2016-04-08 DIAGNOSIS — O3663X0 Maternal care for excessive fetal growth, third trimester, not applicable or unspecified: Secondary | ICD-10-CM | POA: Diagnosis present

## 2016-04-08 DIAGNOSIS — Z3A39 39 weeks gestation of pregnancy: Secondary | ICD-10-CM | POA: Diagnosis not present

## 2016-04-08 DIAGNOSIS — Z302 Encounter for sterilization: Secondary | ICD-10-CM

## 2016-04-08 DIAGNOSIS — O9081 Anemia of the puerperium: Secondary | ICD-10-CM | POA: Diagnosis not present

## 2016-04-08 DIAGNOSIS — O24419 Gestational diabetes mellitus in pregnancy, unspecified control: Secondary | ICD-10-CM

## 2016-04-08 DIAGNOSIS — Z6841 Body Mass Index (BMI) 40.0 and over, adult: Secondary | ICD-10-CM | POA: Diagnosis not present

## 2016-04-08 LAB — PREPARE RBC (CROSSMATCH)

## 2016-04-08 LAB — GLUCOSE, CAPILLARY: Glucose-Capillary: 117 mg/dL — ABNORMAL HIGH (ref 65–99)

## 2016-04-08 SURGERY — Surgical Case
Anesthesia: Spinal | Laterality: Bilateral

## 2016-04-08 MED ORDER — DEXTROSE 5 % IV SOLN
INTRAVENOUS | Status: DC | PRN
  Administered 2016-04-08: 100 mL via INTRAVENOUS

## 2016-04-08 MED ORDER — SERTRALINE HCL 100 MG PO TABS
100.0000 mg | ORAL_TABLET | Freq: Every day | ORAL | Status: DC
  Administered 2016-04-09 – 2016-04-10 (×3): 100 mg via ORAL
  Filled 2016-04-08 (×3): qty 1

## 2016-04-08 MED ORDER — MORPHINE SULFATE-NACL 0.5-0.9 MG/ML-% IV SOSY
PREFILLED_SYRINGE | INTRAVENOUS | Status: AC
  Filled 2016-04-08: qty 1

## 2016-04-08 MED ORDER — OXYCODONE-ACETAMINOPHEN 5-325 MG PO TABS: 2.0000 | ORAL_TABLET | ORAL | Status: DC | PRN

## 2016-04-08 MED ORDER — FENTANYL CITRATE (PF) 100 MCG/2ML IJ SOLN
INTRAMUSCULAR | Status: AC
  Filled 2016-04-08: qty 2

## 2016-04-08 MED ORDER — NALOXONE HCL 0.4 MG/ML IJ SOLN: 0.4000 mg | INTRAMUSCULAR | Status: DC | PRN

## 2016-04-08 MED ORDER — LACTATED RINGERS IV SOLN
INTRAVENOUS | Status: DC
  Administered 2016-04-08 (×2): via INTRAVENOUS

## 2016-04-08 MED ORDER — SIMETHICONE 80 MG PO CHEW
80.0000 mg | CHEWABLE_TABLET | Freq: Three times a day (TID) | ORAL | Status: DC
  Administered 2016-04-08 – 2016-04-11 (×9): 80 mg via ORAL
  Filled 2016-04-08 (×9): qty 1

## 2016-04-08 MED ORDER — ONDANSETRON HCL 4 MG/2ML IJ SOLN
INTRAMUSCULAR | Status: AC
  Filled 2016-04-08: qty 2

## 2016-04-08 MED ORDER — OXYTOCIN 40 UNITS IN LACTATED RINGERS INFUSION - SIMPLE MED: 2.5000 [IU]/h | INTRAVENOUS | Status: DC

## 2016-04-08 MED ORDER — IBUPROFEN 600 MG PO TABS
600.0000 mg | ORAL_TABLET | Freq: Four times a day (QID) | ORAL | Status: DC
Start: 2016-04-08 — End: 2016-04-11
  Administered 2016-04-08 – 2016-04-11 (×11): 600 mg via ORAL
  Filled 2016-04-08 (×11): qty 1

## 2016-04-08 MED ORDER — FENTANYL CITRATE (PF) 100 MCG/2ML IJ SOLN
INTRAMUSCULAR | Status: DC | PRN
  Administered 2016-04-08: 20 ug via INTRATHECAL

## 2016-04-08 MED ORDER — LACTATED RINGERS IV SOLN: INTRAVENOUS | Status: DC

## 2016-04-08 MED ORDER — LACTATED RINGERS IV SOLN
INTRAVENOUS | Status: DC | PRN
  Administered 2016-04-08: 10:00:00 via INTRAVENOUS

## 2016-04-08 MED ORDER — DEXAMETHASONE SODIUM PHOSPHATE 4 MG/ML IJ SOLN
INTRAMUSCULAR | Status: AC
  Filled 2016-04-08: qty 1

## 2016-04-08 MED ORDER — SIMETHICONE 80 MG PO CHEW: 80.0000 mg | CHEWABLE_TABLET | ORAL | Status: DC | PRN

## 2016-04-08 MED ORDER — TETANUS-DIPHTH-ACELL PERTUSSIS 5-2.5-18.5 LF-MCG/0.5 IM SUSP: 0.5000 mL | Freq: Once | INTRAMUSCULAR | Status: DC

## 2016-04-08 MED ORDER — MORPHINE SULFATE (PF) 0.5 MG/ML IJ SOLN
INTRAMUSCULAR | Status: DC | PRN
  Administered 2016-04-08: .2 mg via INTRATHECAL

## 2016-04-08 MED ORDER — NALBUPHINE HCL 10 MG/ML IJ SOLN: 5.0000 mg | Freq: Once | INTRAMUSCULAR | Status: DC | PRN

## 2016-04-08 MED ORDER — MEPERIDINE HCL 25 MG/ML IJ SOLN: 6.2500 mg | INTRAMUSCULAR | Status: DC | PRN

## 2016-04-08 MED ORDER — NALBUPHINE HCL 10 MG/ML IJ SOLN: 5.0000 mg | INTRAMUSCULAR | Status: DC | PRN

## 2016-04-08 MED ORDER — DIPHENHYDRAMINE HCL 25 MG PO CAPS: 25.0000 mg | ORAL_CAPSULE | Freq: Four times a day (QID) | ORAL | Status: DC | PRN

## 2016-04-08 MED ORDER — SIMETHICONE 80 MG PO CHEW
80.0000 mg | CHEWABLE_TABLET | ORAL | Status: DC
  Administered 2016-04-08 – 2016-04-10 (×3): 80 mg via ORAL
  Filled 2016-04-08 (×3): qty 1

## 2016-04-08 MED ORDER — SODIUM CHLORIDE 0.9% FLUSH
INTRAVENOUS | Status: AC
  Administered 2016-04-08: 11:00:00
  Filled 2016-04-08: qty 3

## 2016-04-08 MED ORDER — ONDANSETRON HCL 4 MG/2ML IJ SOLN
INTRAMUSCULAR | Status: DC | PRN
  Administered 2016-04-08: 4 mg via INTRAVENOUS

## 2016-04-08 MED ORDER — DIPHENHYDRAMINE HCL 50 MG/ML IJ SOLN: 12.5000 mg | INTRAMUSCULAR | Status: DC | PRN

## 2016-04-08 MED ORDER — PROMETHAZINE HCL 25 MG/ML IJ SOLN: 6.2500 mg | INTRAMUSCULAR | Status: DC | PRN

## 2016-04-08 MED ORDER — MENTHOL 3 MG MT LOZG: 1.0000 | LOZENGE | OROMUCOSAL | Status: DC | PRN

## 2016-04-08 MED ORDER — SCOPOLAMINE 1 MG/3DAYS TD PT72
MEDICATED_PATCH | TRANSDERMAL | Status: AC
  Administered 2016-04-08: 1.5 mg via TRANSDERMAL
  Filled 2016-04-08: qty 1

## 2016-04-08 MED ORDER — ONDANSETRON HCL 4 MG/2ML IJ SOLN: 4.0000 mg | Freq: Three times a day (TID) | INTRAMUSCULAR | Status: DC | PRN

## 2016-04-08 MED ORDER — SODIUM CHLORIDE 0.9 % IR SOLN
Status: DC | PRN
  Administered 2016-04-08: 1000 mL

## 2016-04-08 MED ORDER — ZOLPIDEM TARTRATE 5 MG PO TABS: 5.0000 mg | ORAL_TABLET | Freq: Every evening | ORAL | Status: DC | PRN

## 2016-04-08 MED ORDER — METOCLOPRAMIDE HCL 5 MG/ML IJ SOLN
INTRAMUSCULAR | Status: DC | PRN
  Administered 2016-04-08: 10 mg via INTRAVENOUS

## 2016-04-08 MED ORDER — BUPIVACAINE HCL (PF) 0.25 % IJ SOLN
INTRAMUSCULAR | Status: AC
  Filled 2016-04-08: qty 30

## 2016-04-08 MED ORDER — PHENYLEPHRINE 8 MG IN D5W 100 ML (0.08MG/ML) PREMIX OPTIME
INJECTION | INTRAVENOUS | Status: DC | PRN
  Administered 2016-04-08: 60 ug/min via INTRAVENOUS

## 2016-04-08 MED ORDER — KETOROLAC TROMETHAMINE 30 MG/ML IJ SOLN: 30.0000 mg | Freq: Four times a day (QID) | INTRAMUSCULAR | Status: DC | PRN

## 2016-04-08 MED ORDER — OXYCODONE HCL 5 MG PO TABS: 5.0000 mg | ORAL_TABLET | Freq: Once | ORAL | Status: DC | PRN

## 2016-04-08 MED ORDER — PHENYLEPHRINE 8 MG IN D5W 100 ML (0.08MG/ML) PREMIX OPTIME
INJECTION | INTRAVENOUS | Status: AC
  Filled 2016-04-08: qty 100

## 2016-04-08 MED ORDER — BUPIVACAINE IN DEXTROSE 0.75-8.25 % IT SOLN
INTRATHECAL | Status: DC | PRN
  Administered 2016-04-08: 1.8 mL via INTRATHECAL

## 2016-04-08 MED ORDER — LACTATED RINGERS IV SOLN
Freq: Once | INTRAVENOUS | Status: DC
  Administered 2016-04-08: 09:00:00 via INTRAVENOUS

## 2016-04-08 MED ORDER — LACTATED RINGERS IV SOLN
INTRAVENOUS | Status: DC | PRN
  Administered 2016-04-08 (×3): via INTRAVENOUS

## 2016-04-08 MED ORDER — SENNOSIDES-DOCUSATE SODIUM 8.6-50 MG PO TABS
2.0000 | ORAL_TABLET | ORAL | Status: DC
  Administered 2016-04-08 – 2016-04-10 (×3): 2 via ORAL
  Filled 2016-04-08 (×3): qty 2

## 2016-04-08 MED ORDER — OXYTOCIN 10 UNIT/ML IJ SOLN
INTRAMUSCULAR | Status: AC
  Filled 2016-04-08: qty 4

## 2016-04-08 MED ORDER — METOCLOPRAMIDE HCL 5 MG/ML IJ SOLN
INTRAMUSCULAR | Status: AC
  Filled 2016-04-08: qty 2

## 2016-04-08 MED ORDER — CLINDAMYCIN PHOSPHATE 900 MG/50ML IV SOLN: INTRAVENOUS | Status: DC | PRN

## 2016-04-08 MED ORDER — KETOROLAC TROMETHAMINE 30 MG/ML IJ SOLN
INTRAMUSCULAR | Status: AC
  Filled 2016-04-08: qty 1

## 2016-04-08 MED ORDER — SCOPOLAMINE 1 MG/3DAYS TD PT72
1.0000 | MEDICATED_PATCH | Freq: Once | TRANSDERMAL | Status: DC
  Administered 2016-04-08: 1.5 mg via TRANSDERMAL

## 2016-04-08 MED ORDER — DIPHENHYDRAMINE HCL 25 MG PO CAPS
25.0000 mg | ORAL_CAPSULE | ORAL | Status: DC | PRN
Start: 2016-04-08 — End: 2016-04-09

## 2016-04-08 MED ORDER — WITCH HAZEL-GLYCERIN EX PADS: 1.0000 "application " | MEDICATED_PAD | CUTANEOUS | Status: DC | PRN

## 2016-04-08 MED ORDER — PRENATAL MULTIVITAMIN CH
1.0000 | ORAL_TABLET | Freq: Every day | ORAL | Status: DC
  Administered 2016-04-09 – 2016-04-10 (×2): 1 via ORAL
  Filled 2016-04-08 (×2): qty 1

## 2016-04-08 MED ORDER — OXYCODONE-ACETAMINOPHEN 5-325 MG PO TABS: 1.0000 | ORAL_TABLET | ORAL | Status: DC | PRN

## 2016-04-08 MED ORDER — NALOXONE HCL 2 MG/2ML IJ SOSY
1.0000 ug/kg/h | PREFILLED_SYRINGE | INTRAVENOUS | Status: DC | PRN
  Filled 2016-04-08: qty 2

## 2016-04-08 MED ORDER — OXYCODONE HCL 5 MG/5ML PO SOLN: 5.0000 mg | Freq: Once | ORAL | Status: DC | PRN

## 2016-04-08 MED ORDER — DEXAMETHASONE SODIUM PHOSPHATE 10 MG/ML IJ SOLN
INTRAMUSCULAR | Status: DC | PRN
  Administered 2016-04-08: 4 mg via INTRAVENOUS

## 2016-04-08 MED ORDER — FENTANYL CITRATE (PF) 100 MCG/2ML IJ SOLN: 25.0000 ug | INTRAMUSCULAR | Status: DC | PRN

## 2016-04-08 MED ORDER — ACETAMINOPHEN 500 MG PO TABS
1000.0000 mg | ORAL_TABLET | Freq: Four times a day (QID) | ORAL | Status: AC
  Administered 2016-04-08 – 2016-04-09 (×4): 1000 mg via ORAL
  Filled 2016-04-08 (×4): qty 2

## 2016-04-08 MED ORDER — OXYTOCIN 10 UNIT/ML IJ SOLN
INTRAVENOUS | Status: DC | PRN
  Administered 2016-04-08: 40 [IU] via INTRAVENOUS

## 2016-04-08 MED ORDER — SODIUM CHLORIDE 0.9% FLUSH: 3.0000 mL | INTRAVENOUS | Status: DC | PRN

## 2016-04-08 MED ORDER — COCONUT OIL OIL
1.0000 "application " | TOPICAL_OIL | Status: DC | PRN
  Filled 2016-04-08: qty 120

## 2016-04-08 MED ORDER — SCOPOLAMINE 1 MG/3DAYS TD PT72: 1.0000 | MEDICATED_PATCH | Freq: Once | TRANSDERMAL | Status: DC

## 2016-04-08 MED ORDER — KETOROLAC TROMETHAMINE 30 MG/ML IJ SOLN
30.0000 mg | Freq: Four times a day (QID) | INTRAMUSCULAR | Status: DC | PRN
  Administered 2016-04-08: 30 mg via INTRAMUSCULAR

## 2016-04-08 MED ORDER — LIDOCAINE HCL 1 % IJ SOLN
INTRAMUSCULAR | Status: AC
  Filled 2016-04-08: qty 20

## 2016-04-08 MED ORDER — DIBUCAINE 1 % RE OINT: 1.0000 "application " | TOPICAL_OINTMENT | RECTAL | Status: DC | PRN

## 2016-04-08 MED ORDER — BUPIVACAINE HCL (PF) 0.25 % IJ SOLN
INTRAMUSCULAR | Status: DC | PRN
  Administered 2016-04-08: 10 mL

## 2016-04-08 SURGICAL SUPPLY — 50 items
APL SKNCLS STERI-STRIP NONHPOA (GAUZE/BANDAGES/DRESSINGS)
BARRIER ADHS 3X4 INTERCEED (GAUZE/BANDAGES/DRESSINGS) ×2 IMPLANT
BENZOIN TINCTURE PRP APPL 2/3 (GAUZE/BANDAGES/DRESSINGS) IMPLANT
BRR ADH 4X3 ABS CNTRL BYND (GAUZE/BANDAGES/DRESSINGS) ×1
CLAMP CORD UMBIL (MISCELLANEOUS) IMPLANT
CLOTH BEACON ORANGE TIMEOUT ST (SAFETY) ×2 IMPLANT
CONTAINER PREFILL 10% NBF 15ML (MISCELLANEOUS) IMPLANT
DRAPE C SECTION CLR SCREEN (DRAPES) ×2 IMPLANT
DRSG OPSITE POSTOP 4X10 (GAUZE/BANDAGES/DRESSINGS) ×2 IMPLANT
DRSG OPSITE POSTOP 4X12 (GAUZE/BANDAGES/DRESSINGS) ×1 IMPLANT
DURAPREP 26ML APPLICATOR (WOUND CARE) ×2 IMPLANT
ELECT REM PT RETURN 9FT ADLT (ELECTROSURGICAL) ×2
ELECTRODE REM PT RTRN 9FT ADLT (ELECTROSURGICAL) ×1 IMPLANT
EXTRACTOR VACUUM M CUP 4 TUBE (SUCTIONS) IMPLANT
GLOVE BIOGEL PI IND STRL 7.0 (GLOVE) ×2 IMPLANT
GLOVE BIOGEL PI INDICATOR 7.0 (GLOVE) ×2
GLOVE ECLIPSE 6.5 STRL STRAW (GLOVE) ×2 IMPLANT
GOWN STRL REUS W/TWL LRG LVL3 (GOWN DISPOSABLE) ×4 IMPLANT
HEMOSTAT SURGICEL 2X14 (HEMOSTASIS) ×1 IMPLANT
KIT ABG SYR 3ML LUER SLIP (SYRINGE) IMPLANT
NDL HYPO 25X5/8 SAFETYGLIDE (NEEDLE) IMPLANT
NEEDLE HYPO 22GX1.5 SAFETY (NEEDLE) ×2 IMPLANT
NEEDLE HYPO 25X5/8 SAFETYGLIDE (NEEDLE) IMPLANT
NS IRRIG 1000ML POUR BTL (IV SOLUTION) ×2 IMPLANT
PACK C SECTION WH (CUSTOM PROCEDURE TRAY) ×2 IMPLANT
PAD OB MATERNITY 4.3X12.25 (PERSONAL CARE ITEMS) ×2 IMPLANT
RETRACTOR WND ALEXIS 25 LRG (MISCELLANEOUS) IMPLANT
RTRCTR C-SECT PINK 25CM LRG (MISCELLANEOUS) IMPLANT
RTRCTR WOUND ALEXIS 25CM LRG (MISCELLANEOUS) ×2
SPONGE LAP 18X18 X RAY DECT (DISPOSABLE) ×5 IMPLANT
STAPLER VISISTAT 35W (STAPLE) ×1 IMPLANT
STRIP CLOSURE SKIN 1/2X4 (GAUZE/BANDAGES/DRESSINGS) IMPLANT
SUT CHROMIC GUT AB #0 18 (SUTURE) IMPLANT
SUT MNCRL 0 VIOLET CTX 36 (SUTURE) ×3 IMPLANT
SUT MON AB 2-0 SH 27 (SUTURE)
SUT MON AB 2-0 SH27 (SUTURE) IMPLANT
SUT MON AB 3-0 SH 27 (SUTURE)
SUT MON AB 3-0 SH27 (SUTURE) IMPLANT
SUT MON AB 4-0 PS1 27 (SUTURE) IMPLANT
SUT MONOCRYL 0 CTX 36 (SUTURE) ×3
SUT PLAIN 2 0 (SUTURE)
SUT PLAIN 2 0 XLH (SUTURE) IMPLANT
SUT PLAIN ABS 2-0 CT1 27XMFL (SUTURE) IMPLANT
SUT VIC AB 0 CT1 36 (SUTURE) ×4 IMPLANT
SUT VIC AB 2-0 CT1 27 (SUTURE) ×2
SUT VIC AB 2-0 CT1 TAPERPNT 27 (SUTURE) ×1 IMPLANT
SUT VIC AB 4-0 PS2 27 (SUTURE) IMPLANT
SYR CONTROL 10ML LL (SYRINGE) ×2 IMPLANT
TOWEL OR 17X24 6PK STRL BLUE (TOWEL DISPOSABLE) ×2 IMPLANT
TRAY FOLEY CATH SILVER 14FR (SET/KITS/TRAYS/PACK) IMPLANT

## 2016-04-08 NOTE — Anesthesia Procedure Notes (Signed)
Spinal  Patient location during procedure: OR Start time: 04/08/2016 9:10 AM End time: 04/08/2016 10:11 AM Staffing Anesthesiologist: Bonita QuinGUIDETTI, Jenan Ellegood S Performed: anesthesiologist  Preanesthetic Checklist Completed: patient identified, site marked, surgical consent, pre-op evaluation, timeout performed, IV checked, risks and benefits discussed and monitors and equipment checked Spinal Block Patient position: sitting Prep: DuraPrep Patient monitoring: blood pressure, continuous pulse ox, cardiac monitor and heart rate Approach: midline Location: L4-5 Injection technique: single-shot Needle Needle type: Pencil-Tip  Needle gauge: 25 G

## 2016-04-08 NOTE — Transfer of Care (Signed)
Immediate Anesthesia Transfer of Care Note  Patient: Katie LollKristina Russman  Procedure(s) Performed: Procedure(s): CESAREAN SECTION WITH BILATERAL TUBAL LIGATION (Bilateral)  Patient Location: PACU  Anesthesia Type:Spinal  Level of Consciousness: awake, alert  and oriented  Airway & Oxygen Therapy: Patient Spontanous Breathing  Post-op Assessment: Report given to RN and Post -op Vital signs reviewed and stable  Post vital signs: Reviewed and stable  Last Vitals:  Vitals:   04/08/16 0736 04/08/16 0800  BP:  193/77  Pulse: 97   Resp: 20   Temp: 36.7 C     Last Pain:  Vitals:   04/08/16 0736  TempSrc: Oral      Patients Stated Pain Goal: 3 (04/08/16 0736)  Complications: No apparent anesthesia complications

## 2016-04-08 NOTE — Anesthesia Postprocedure Evaluation (Signed)
Anesthesia Post Note  Patient: Katie [name]  Procedure(s) Performed: Procedure(s) (LRB): CESAREAN SECTION WITH BILATERAL TUBAL LIGATION (Bilateral)  Patient location during evaluation: Mother Baby Anesthesia Type: Spinal Level of consciousness: awake and alert and oriented Pain management: satisfactory to patient Vital Signs Assessment: post-procedure vital signs reviewed and stable Respiratory status: respiratory function stable and spontaneous breathing Cardiovascular status: blood pressure returned to baseline Postop Assessment: no headache, no backache, spinal receding, patient able to bend at knees and adequate PO intake Anesthetic complications: no     Last Vitals:  Vitals:   04/08/16 1315 04/08/16 1415  BP: 123/63 128/66  Pulse: (!) 53 61  Resp: 18 18  Temp: 36.3 C 36.8 C    Last Pain:  Vitals:   04/08/16 1745  TempSrc:   PainSc: 3    Pain Goal: Patients Stated Pain Goal: 3 (04/08/16 0736)               Karleen DolphinFUSSELL,Demontae Antunes

## 2016-04-08 NOTE — Brief Op Note (Signed)
04/08/2016  10:33 AM  PATIENT:  Katie [name]  40 y.o. female  PRE-OPERATIVE DIAGNOSIS:  Class A2 Gestational Diabetes, Fetal Macrosomia, Polyhydramnios, Morbid Obesity,DESIRE STERILIZATION, term gestation  POST-OPERATIVE DIAGNOSIS:  Class A2 Gestational Diabetes, Fetal Macrosomia, Polyhydramnios, Morbid Obesity,DESIRE STERILIZATION, malpresentation, term gestation  PROCEDURE:  Primary Cesarean section kerr hysterotomy, modified P[place]oy tubal ligation  SURGEON:  Surgeon(s) and Role:    * IT trainerheronette Trentan Trippe, MD - Primary  PHYSICIAN ASSISTANT:   ASSISTANTS: Arlan Organaniela Paul, CNM   ANESTHESIA:   spinal Finding: live female in transverse/oblique breech lie, Apgar 8/8, nl tubes and ovaries( elongated) EBL:  Total I/O In: 3000 [I.V.:3000] Out: 1100 [Urine:100; Blood:1000]  BLOOD ADMINISTERED:none  DRAINS: none   LOCAL MEDICATIONS USED:  MARCAINE     SPECIMEN:  Source of Specimen:  portion of right and left tube  DISPOSITION OF SPECIMEN:  PATHOLOGY  COUNTS:  YES  TOURNIQUET:  * No tourniquets in log *  DICTATION: .Dragon Dictation and Other Dictation: Dictation Number [id number]  PLAN OF CARE: Admit to inpatient   PATIENT DISPOSITION:  PACU - hemodynamically stable.   Delay start of Pharmacological VTE agent (>24hrs) due to surgical blood loss or risk of bleeding: no

## 2016-04-08 NOTE — Anesthesia Preprocedure Evaluation (Signed)
Anesthesia Evaluation  Patient identified by MRN, date of birth, ID band Patient awake    Reviewed: Allergy & Precautions, H&P , NPO status , Patient's Chart, lab work & pertinent test results, reviewed documented beta blocker date and time   History of Anesthesia Complications (+) PONV and history of anesthetic complications  Airway Mallampati: III  TM Distance: >3 FB Neck ROM: full    Dental  (+) Teeth Intact   Pulmonary neg pulmonary ROS, sleep apnea and Continuous Positive Airway Pressure Ventilation ,    breath sounds clear to auscultation       Cardiovascular negative cardio ROS   Rhythm:regular Rate:Normal     Neuro/Psych Anxiety negative neurological ROS  negative psych ROS   GI/Hepatic negative GI ROS, Neg liver ROS,   Endo/Other  diabetes, Gestational, Oral Hypoglycemic AgentsMorbid obesity (super morbid obese per bmi criteria)  Renal/GU negative Renal ROS     Musculoskeletal   Abdominal   Peds  Hematology negative hematology ROS (+)   Anesthesia Other Findings   Reproductive/Obstetrics (+) Pregnancy                             Anesthesia Physical  Anesthesia Plan  ASA: III  Anesthesia Plan: Spinal   Post-op Pain Management:    Induction:   Airway Management Planned:   Additional Equipment:   Intra-op Plan:   Post-operative Plan:   Informed Consent: I have reviewed the patients History and Physical, chart, labs and discussed the procedure including the risks, benefits and alternatives for the proposed anesthesia with the patient or authorized representative who has indicated his/her understanding and acceptance.     Plan Discussed with:   Anesthesia Plan Comments:         Anesthesia Quick Evaluation

## 2016-04-08 NOTE — Lactation Note (Signed)
This note was copied from a baby's chart. Lactation Consultation Note  Patient Name: Boy Delene LollKristina Macleod ZOXWR'UToday's Date: 04/08/2016 Reason for consult: Initial assessment  With this mom of a term baby, now 7 hours old. He was in the CNS, under and oxyhood, for hours after birth, and was now with mom, skin to skin.He initially had a low glucose.  He was mildly grunting, but very pink. The baby began rooting, and I assisted the mom with latching the baby in cross cradle hold, . He suckled well for 10 minutes, and then fell asleep. I started mom pumping in initiation setting, and she expressed 7 ml's of colostrum. The baby took the 7 ml's by bottle , and then an additional 8 ml's of alimentum. Mom is to feed baby with cues, and at least every 3 hours, bottle feed 15 ml's - first EBM, and then alimentum, to equal 15 ml's Dad will have to help with positioning and holding the baby, due to the IV in mom's right arm, requiring her arm to be straight. Basic breast feeding and lactation teaching done, and pump use and part care. Mom knows to call for questions/conerns.    Maternal Data Formula Feeding for Exclusion: Yes (bab y with stressful delivery, borderline blood sugars and mild RDS, breast feeding then supp with formula, as per MD) Has patient been taught Hand Expression?: Yes Does the patient have breastfeeding experience prior to this delivery?: Yes  Feeding Feeding Type: Bottle Fed - Formula Nipple Type: Slow - flow Length of feed: 10 min (10 at breast, 15 with bottle)  LATCH Score/Interventions Latch: Repeated attempts needed to sustain latch, nipple held in mouth throughout feeding, stimulation needed to elicit sucking reflex. Intervention(s): Adjust position;Assist with latch;Breast massage  Audible Swallowing: A few with stimulation  Type of Nipple: Everted at rest and after stimulation  Comfort (Breast/Nipple): Soft / non-tender     Hold (Positioning): Assistance needed to  correctly position infant at breast and maintain latch. Intervention(s): Breastfeeding basics reviewed;Support Pillows;Position options;Skin to skin  LATCH Score: 7  Lactation Tools Discussed/Used Pump Review: Setup, frequency, and cleaning;Milk Storage;Other (comment) (breast feeding and lactation teaching, pump setup , and part care) Initiated by:: Danton Claphristine Myrah Strawderman, RN, IBCLC Date initiated:: 04/08/16   Consult Status Consult Status: Follow-up Date: 04/09/16 Follow-up type: In-patient    Alfred LevinsLee, Jaquez Farrington Anne 04/08/2016, 6:29 PM

## 2016-04-09 ENCOUNTER — Encounter (HOSPITAL_COMMUNITY): Payer: Self-pay | Admitting: Obstetrics and Gynecology

## 2016-04-09 DIAGNOSIS — L259 Unspecified contact dermatitis, unspecified cause: Secondary | ICD-10-CM | POA: Diagnosis not present

## 2016-04-09 LAB — CBC
HCT: 23.8 % — ABNORMAL LOW (ref 36.0–46.0)
HEMOGLOBIN: 8 g/dL — AB (ref 12.0–15.0)
MCH: 26.8 pg (ref 26.0–34.0)
MCHC: 33.6 g/dL (ref 30.0–36.0)
MCV: 79.9 fL (ref 78.0–100.0)
Platelets: 197 10*3/uL (ref 150–400)
RBC: 2.98 MIL/uL — AB (ref 3.87–5.11)
RDW: 16.6 % — ABNORMAL HIGH (ref 11.5–15.5)
WBC: 9.7 10*3/uL (ref 4.0–10.5)

## 2016-04-09 MED ORDER — ACETAMINOPHEN 325 MG PO TABS
325.0000 mg | ORAL_TABLET | ORAL | Status: DC | PRN
  Administered 2016-04-09 (×3): 325 mg via ORAL
  Filled 2016-04-09 (×3): qty 1

## 2016-04-09 MED ORDER — SCOPOLAMINE 1 MG/3DAYS TD PT72
1.0000 | MEDICATED_PATCH | TRANSDERMAL | Status: DC
  Filled 2016-04-09 (×2): qty 1

## 2016-04-09 MED ORDER — OXYCODONE-ACETAMINOPHEN 5-325 MG PO TABS
0.5000 | ORAL_TABLET | ORAL | Status: DC | PRN
  Administered 2016-04-09 – 2016-04-11 (×7): 1 via ORAL
  Filled 2016-04-09 (×7): qty 1

## 2016-04-09 MED ORDER — HYDROCORTISONE 1 % EX CREA
TOPICAL_CREAM | Freq: Two times a day (BID) | CUTANEOUS | Status: DC
  Administered 2016-04-09 – 2016-04-11 (×3): via TOPICAL
  Filled 2016-04-09: qty 28

## 2016-04-09 NOTE — Lactation Note (Signed)
This note was copied from a baby's chart. Lactation Consultation Note  Patient Name: Katie Delene LollKristina Pokorski ZOXWR'UToday's Date: 04/09/2016 Reason for consult: Follow-up assessment  With this mom and term baby, now 7728 hours old. Mom confirmed the order of feeding the baby - first breast feed, then bottle feed (EBM prior to formula),up to 30 ml's every 3 hours for today, and then pump . Dad can do the bottle feeding, while mom pumps. Mom is doing well with pumping, no tenderness. Mom knows to call for questions/conerns.    Maternal Data    Feeding Feeding Type: Bottle Fed - Formula Nipple Type: Slow - flow  LATCH Score/Interventions                      Lactation Tools Discussed/Used     Consult Status Consult Status: Follow-up Date: 04/10/16 Follow-up type: In-patient    Alfred LevinsLee, Edem Tiegs Anne 04/09/2016, 2:35 PM

## 2016-04-09 NOTE — Progress Notes (Signed)
POSTOPERATIVE DAY # 1 S/P CS  S:         Reports feeling well             Tolerating po intake / no nausea / no vomiting / no flatus / no BM             Bleeding is light             Pain controlled with motrin and 1/2 dose oxycodone             Up ad lib / ambulatory/ voiding QS  Newborn breast feeding / circumcision planned  O:  VS: BP (!) 126/58   Pulse 62   Temp 97.6 F (36.4 C)   Resp 18   Ht 5\' 3"  (1.6 m)   Wt (!) 166.9 kg (368 lb)   LMP 06/11/2015   SpO2 98%   Breastfeeding? Unknown   BMI 65.19 kg/m    LABS:               Recent Labs  04/09/16 0512  WBC 9.7  HGB 8.0*  PLT 197               Bloodtype: --/--/B POS, B POS (11/22 1040)  Rubella: Immune (04/25 0000)                                             I&O: Intake/Output      11/24 0701 - 11/25 0700 11/25 0701 - 11/26 0700   P.O. 600    I.V. (mL/kg) 4200 (25.2)    Total Intake(mL/kg) 4800 (28.8)    Urine (mL/kg/hr) 800    Blood 1000    Total Output 1800     Net +3000          Urine Occurrence 1 x                Physical Exam:             Alert and Oriented X3  Lungs: Clear and unlabored  Heart: regular rate and rhythm / no mumurs  Abdomen: soft, non-tender, non-distended, macular rash with erythema in shape of OR drape             Panus: mild edema - no warmth of erythema              Fundus: firm, non-tender, Ueven             Dressing intact              Incision:  no erythema / no ecchymosis / no drainage  Perineum: intact  Lochia: light  Extremities: 1+ pedal edema, no calf pain or tenderness  A:        POD # 1 S/P CS            GDMa2 delivered - check FBS tomorrow am / 2hr GTT 6-12 weeks PP             Contact dermatitis from drape and prep  P:        Routine postoperative care              Hydrocortisone cream to dermatitis rash             circ by MD prior to DC     Marlinda MikeBAILEY, TANYA CNM, MSN, [facility]AC[facility] 04/09/2016, 11:28  AM

## 2016-04-10 DIAGNOSIS — D509 Iron deficiency anemia, unspecified: Secondary | ICD-10-CM | POA: Diagnosis present

## 2016-04-10 LAB — TYPE AND SCREEN
ABO/RH(D): B POS
ANTIBODY SCREEN: NEGATIVE
UNIT DIVISION: 0
Unit division: 0

## 2016-04-10 LAB — GLUCOSE, CAPILLARY: Glucose-Capillary: 95 mg/dL (ref 65–99)

## 2016-04-10 MED ORDER — SALINE SPRAY 0.65 % NA SOLN
1.0000 | NASAL | Status: DC | PRN
  Filled 2016-04-10: qty 44

## 2016-04-10 MED ORDER — POLYSACCHARIDE IRON COMPLEX 150 MG PO CAPS
150.0000 mg | ORAL_CAPSULE | Freq: Every day | ORAL | Status: DC
  Administered 2016-04-10 – 2016-04-11 (×2): 150 mg via ORAL
  Filled 2016-04-10 (×2): qty 1

## 2016-04-10 MED ORDER — MAGNESIUM OXIDE 400 (241.3 MG) MG PO TABS
400.0000 mg | ORAL_TABLET | Freq: Every day | ORAL | Status: DC
  Administered 2016-04-10 – 2016-04-11 (×2): 400 mg via ORAL
  Filled 2016-04-10 (×2): qty 1

## 2016-04-10 NOTE — Progress Notes (Signed)
POSTOPERATIVE DAY # 2 S/P CS   S:         Reports feeling well - still little sore             Tolerating po intake / no nausea / no vomiting / + flatus / no BM             Bleeding is light             Pain controlled with motrin - tylenol - percocet             Up ad lib / ambulatory/ voiding QS  Newborn breast feeding   O:  VS: BP (!) 113/58 (BP Location: Right Arm)   Pulse 71   Temp 98.1 F (36.7 C) (Oral)   Resp 18   Ht 5\' 3"  (1.6 m)   Wt (!) 166.9 kg (368 lb)   SpO2 98%   Breastfeeding? Unknown   BMI 65.19 kg/m    LABS:               Recent Labs  04/09/16 0512  WBC 9.7  HGB 8.0*  PLT 197               Bloodtype: --/--/B POS, B POS (11/22 1040)  Rubella: Immune (04/25 0000)                                Physical Exam:             Alert and Oriented X3  Lungs: Clear and unlabored  Heart: regular rate and rhythm / no mumurs  Abdomen: soft, non-tender, non-distended, rash decreased - using topical hydrocortisone TID             Fundus: firm, non-tender, Ueven             Dressing intact              Incision:no erythema / noi ecchymosis / no drainage  Perineum: intact  Lochia: light  Extremities: 1+ edema, no calf pain or tenderness  A:        POD # 2 S/P CS            IDA with ABL anemia - stable  P:        Routine postoperative care              Iron and magnesium             Pain management today - ambulate             DC tomorrow     Marlinda MikeBAILEY, Katie [name] CNM, MSN, FACNM 04/10/2016, 8:21 AM

## 2016-04-11 LAB — GLUCOSE, CAPILLARY: GLUCOSE-CAPILLARY: 104 mg/dL — AB (ref 65–99)

## 2016-04-11 MED ORDER — ACETAMINOPHEN 325 MG PO TABS: 325.0000 mg | ORAL_TABLET | ORAL | Status: DC | PRN

## 2016-04-11 MED ORDER — IBUPROFEN 600 MG PO TABS: 600.0000 mg | ORAL_TABLET | Freq: Four times a day (QID) | ORAL | 0 refills | Status: DC

## 2016-04-11 MED ORDER — POLYSACCHARIDE IRON COMPLEX 150 MG PO CAPS: 150.0000 mg | ORAL_CAPSULE | Freq: Two times a day (BID) | ORAL | 1 refills | Status: DC

## 2016-04-11 MED ORDER — SENNOSIDES-DOCUSATE SODIUM 8.6-50 MG PO TABS: 2.0000 | ORAL_TABLET | ORAL | Status: DC

## 2016-04-11 MED ORDER — OXYCODONE-ACETAMINOPHEN 5-325 MG PO TABS: 2.0000 | ORAL_TABLET | ORAL | 0 refills | Status: DC | PRN

## 2016-04-11 MED ORDER — MAGNESIUM OXIDE 400 (241.3 MG) MG PO TABS: 400.0000 mg | ORAL_TABLET | Freq: Every day | ORAL | Status: DC

## 2016-04-11 MED ORDER — COCONUT OIL OIL: 1.0000 "application " | TOPICAL_OIL | 0 refills | Status: DC | PRN

## 2016-04-11 MED ORDER — ALFALFA 250 MG PO TABS: 2.0000 | ORAL_TABLET | Freq: Four times a day (QID) | ORAL | 0 refills | Status: DC

## 2016-04-11 MED ORDER — SIMETHICONE 80 MG PO CHEW: 80.0000 mg | CHEWABLE_TABLET | Freq: Three times a day (TID) | ORAL | 0 refills | Status: DC

## 2016-04-11 NOTE — Progress Notes (Signed)
Subjective: POD# 3 Information for the patient's newborn:  Fraser DinSingleton, Boy Wetona [[id number]][[id number]]  female   circ completed Baby name: Romie LeveeBeckett  Reports feeling well, ready for DC home.  Feeding: breast Patient reports tolerating PO.  Breast symptoms:+ colostrum Pain controlled with PO meds Denies HA/SOB/C/P/N/V/dizziness. Flatus present. She reports vaginal bleeding as normal, without clots.  She is ambulating, urinating without difficulty.    Honeycomb dressing off in shower accidentally.  Objective:   VS:    Vitals:   04/09/16 1819 04/10/16 0550 04/10/16 1800 04/11/16 0537  BP: (!) 144/61 (!) 113/58 (!) 129/57 136/60  Pulse: 67 71 64 80  Resp: 20 18 17 18   Temp: 98.1 F (36.7 C)  97.9 F (36.6 C)   TempSrc: Oral     SpO2: 98%     Weight:      Height:        No intake or output data in the 24 hours ending 04/11/16 0839      Recent Labs  04/09/16 0512  WBC 9.7  HGB 8.0*  HCT 23.8*  PLT 197     Blood type: --/--/B POS, B POS (11/22 1040)  Rubella: Immune (04/25 0000)     Physical Exam:  General: alert, cooperative and no distress Abdomen: soft, nontender, normal bowel sounds Incision: clean, dry, intact and staples in place Uterine Fundus: firm, below umbilicus, nontender Lochia: minimal Ext: edema +++ pedal and pretibial, no cords or calf tenderness      Assessment/Plan: 40 y.o.   POD# 3. [id number]                  Principal Problem:   Postpartum care following cesarean delivery (11/24)  Active Problems:   Gestational diabetes mellitus, class A2   Morbid obesity with BMI of 50.0-59.9, adult (HCC)   Contact dermatitis - OR prep & drape   IDA (iron deficiency anemia)   Doing well, stable.    Remove staples prior to DC and replace w/ steristrips and HC dressing          Oral Fe and MagOx 4-6 wks PP Contact derm resolving w/ topical steroid, use cream PRN daily until resolved Encourage rest when baby rests Breastfeeding support Encourage to  ambulate Routine post-op care DC home w/ WOB instructions  Neta Mendsaniela C Paul, CNM, MSN 04/11/2016, 8:39 AM

## 2016-04-11 NOTE — Discharge Summary (Signed)
OB Discharge Summary     Patient Name: Katie LollKristina Demartini DOB: [birth year] MRN: [id number]  Date of admission: 04/08/2016 Delivering MD: Vernesha Talbot   Date of discharge: 04/11/2016  Admitting diagnosis: Class A2 Gestational Diabetes, Fetal Macrosomia, Polyhydramnios, Morbid Obesity , desires  Sterilization Intrauterine pregnancy: 39 wks  Secondary diagnosis:  Principal Problem:   Postpartum care following cesarean delivery (11/24)  Active Problems:   Gestational diabetes mellitus, class A2   Morbid obesity with BMI of 50.0-59.9, adult (HCC)   Contact dermatitis - OR prep & drape   IDA (iron deficiency anemia) Depression     Discharge diagnosis: Term Pregnancy Delivered, GDM A2 and Anemia  malpresentation , desires sterilization, depression                                                                                           Complications: None  Hospital course:  Sceduled C/S, TL   40 y.o. yo G3P3003 at Unknown was admitted to the hospital 04/08/2016 for scheduled cesarean section, TL with the following indication:Macrosomia. Class A2 GDM, polyhydramnios Membrane Rupture Time/Date: 9:45 AM ,04/08/2016   Patient delivered a Viable infant.04/08/2016  Details of operation can be found in separate operative note.  Pateint had an uncomplicated postpartum course.  She is ambulating, tolerating a regular diet, passing flatus, and urinating well. Patient is discharged home in stable condition on  04/11/16          Physical exam Vitals:   04/09/16 1819 04/10/16 0550 04/10/16 1800 04/11/16 0537  BP: (!) 144/61 (!) 113/58 (!) 129/57 136/60  Pulse: 67 71 64 80  Resp: 20 18 17 18   Temp: 98.1 F (36.7 C)  97.9 F (36.6 C)   TempSrc: Oral     SpO2: 98%     Weight:      Height:       General: alert, cooperative and no distress Lochia: appropriate Uterine Fundus: firm Incision: Healing well with no significant drainage, No significant erythema DVT Evaluation: No  evidence of DVT seen on physical exam. Calf/Ankle edema is present Labs: Lab Results  Component Value Date   WBC 9.7 04/09/2016   HGB 8.0 (L) 04/09/2016   HCT 23.8 (L) 04/09/2016   MCV 79.9 04/09/2016   PLT 197 04/09/2016   CMP Latest Ref Rng & Units 04/06/2016  Glucose 65 - 99 mg/dL 130(Q113(H)  BUN 6 - 20 mg/dL 16  Creatinine 6.570.44 - 8.461.00 mg/dL 9.620.59  Sodium 952135 - 841145 mmol/L 136  Potassium 3.5 - 5.1 mmol/L 4.7  Chloride 101 - 111 mmol/L 106  CO2 22 - 32 mmol/L 23  Calcium 8.9 - 10.3 mg/dL 9.1  Total Protein 6.0 - 8.3 g/dL -  Total Bilirubin 0.3 - 1.2 mg/dL -  Alkaline Phos 39 - 324117 U/L -  AST 0 - 37 U/L -  ALT 0 - 35 U/L -    Discharge instruction: per After Visit Summary and [facility] booklet  After visit meds:    Medication List    STOP taking these medications   glyBURIDE 5 MG tablet Commonly known as:  DIABETA     TAKE these  medications   acetaminophen 325 MG tablet Commonly known as:  TYLENOL Take 1 tablet (325 mg total) by mouth every 4 (four) hours as needed for mild pain.   Alfalfa 250 MG Tabs Take 2 tablets (500 mg total) by mouth 4 (four) times daily. Start with 2 tabs/day and increase by 2 tabs daily until 8 tabs/day   coconut oil Oil Apply 1 application topically as needed.   ibuprofen 600 MG tablet Commonly known as:  ADVIL,MOTRIN Take 1 tablet (600 mg total) by mouth every 6 (six) hours.   iron polysaccharides 150 MG capsule Commonly known as:  NIFEREX Take 1 capsule (150 mg total) by mouth 2 (two) times daily.   magnesium oxide 400 (241.3 Mg) MG tablet Commonly known as:  MAG-OX Take 1 tablet (400 mg total) by mouth daily.   oxyCODONE-acetaminophen 5-325 MG tablet Commonly known as:  PERCOCET/ROXICET Take 2 tablets by mouth every 4 (four) hours as needed (pain scale > 7).   prenatal multivitamin Tabs tablet Take 1 tablet by mouth daily at 12 noon.   senna-docusate 8.6-50 MG tablet Commonly known as:  Senokot-S Take 2 tablets by mouth  daily. Start taking on:  04/12/2016   sertraline 100 MG tablet Commonly known as:  ZOLOFT Take 100 mg by mouth at bedtime.   simethicone 80 MG chewable tablet Commonly known as:  MYLICON Chew 1 tablet (80 mg total) by mouth 3 (three) times daily after meals.       Diet: routine diet  Activity: Advance as tolerated. Pelvic rest for 6 weeks.   Outpatient follow up:6 weeks Postpartum contraception: TL  Newborn Data: Live born female  Birth Weight: 9 lb 9.8 oz (4360 g) APGAR: 8, 8  Baby Feeding: Breast Disposition:home with mother   04/11/2016 Neta Mendsaniela C Paul, CNM

## 2016-04-11 NOTE — Lactation Note (Signed)
This note was copied from a baby's chart. Lactation Consultation Note Experienced bf mom's 3rd baby. Mom states BF going great. Mom pumped 59m. Milk has came in. Mom has DEBP and needed an extra kit. One given to mom. Mom states baby is satisfied after feedings. Has good I&O.  Encouraged comfort during BF so colostrum flows better and mom will enjoy the feeding longer. Taking deep breaths and breast massage during BF. Mom encouraged to do skin-to-skin. Reviewed newborn feeding behaviors and habits. [place] given w/resources, support groups and [place]. Patient Name: Boy KMoe BrierTKNIOC'ODate: 04/11/2016 Reason for consult: Follow-up assessment   Maternal Data    Feeding Feeding Type: Formula Length of feed: 30 min  LATCH Score/Interventions                      Lactation Tools Discussed/Used     Consult Status Consult Status: Complete Date: 04/11/16 Follow-up type: In-patient    CTheodoro Kalata11/27/2017, 7:31 AM

## 2016-04-11 NOTE — Op Note (Signed)
NAME:  Katie [name], Katie [name]          ACCOUNT NO.:  [id number]  MEDICAL RECORD NO.:  [id number]  LOCATION:                                 FACILITY:  PHYSICIAN:  Maxie BetterSheronette Damond Borchers, M.D.DATE OF BIRTH:  1975-06-13  DATE OF PROCEDURE:  04/08/2016 DATE OF DISCHARGE:                              OPERATIVE REPORT   PREOPERATIVE DIAGNOSES:  Class A2 gestational diabetes, fetal macrosomia, polyhydramnios, morbid obesity, desires sterilization, intrauterine gestation at 3939 weeks.  POSTOPERATIVE DIAGNOSES:  Class A2 gestational diabetes, fetal macrosomia, polyhydramnios, malpresentation, morbid obesity, desires sterilization.  PROCEDURE:  Primary cesarean section, Sharl MaKerr hysterotomy, modified Pomeroy tubal ligation.  ANESTHESIA:  Spinal.  SURGEONS:  Maxie BetterSheronette Anchor Dwan, M.D.  ASSISTANT:  Arlan Organaniela Paul, CNM.  DESCRIPTION OF PROCEDURE:  Under adequate spinal anesthesia, the patient was placed in the supine position with a left lateral tilt.  An indwelling Foley catheter was sterilely placed.  A traxi retractor was placed due to the patient's large pannus.  The patient was then sterilely prepped and draped in usual fashion.  A 0.25% Marcaine was injected along the planned Pfannenstiel skin incision site.  A Pfannenstiel skin incision was then made, carried down to the rectus fascia, rectus fascia was opened transversely, rectus fascia was then bluntly and sharply dissected off the rectus muscle in a superior inferior fashion.  The rectus muscle was split in the midline.  The parietal peritoneum was entered bluntly and extended.  A self-retaining Alexis retractor was then placed.  Lower uterine segment appears to be bulging with no palpable fetal parts within. Prominent large vessels were noted on both sides of the lower uterine segment.  The vesicouterine peritoneum was opened transversely and the bladder was gently dissected off the lower uterine segment displaced inferiorly.   A curvilinear low transverse incision was then made after palpating the uterus with a suggestion of the baby in the breech position.  Copious amount of blood was noted due to those prominent vessels.  Nonetheless, the incision was carried down.  An artificial rupture of membranes occurred.  Copious amniotic fluid was noted.  The incision was extended briefly with a bandage scissors and then opened bluntly in a vertical fashion.  Palpation of fetal parts resulted in the right hand being caught and brought out in the field, which was then put back.  The internal inspection revealed that the baby was in a transverse oblique position, and thus re-palpation again brought the hand out a second time, followed by re-exploration of the uterine cavity felt the feet with the right leg being brought out, followed by the second leg and the baby being delivered in the usual breech maneuvers and bulb suctioned on the abdomen.  Delayed cord clamping was then done and the baby was subsequently transferred to the awaiting pediatricians, who assigned Apgars of 8 and 8 at 1 and 5 minutes respectively.  The placenta was manually removed.  Uterine cavity was cleaned of debris.  Uterine incision had no extension.  There was a bleeding vessel on the left angle.  A 0 Monocryl running lock stitch was started on that left angle, and due to the bleeding collection of vessel, a separate 0 Monocryl figure-of-eight suture was then placed for hemostasis.  The incision's first layer was then closed with 0 Monocryl running lock stitch.  Second layer was imbricated using 0 Monocryl suture.  The uterus was then massaged.  Normal tubes and ovaries were noted bilaterally.  The left fallopian tube was grasped with a Babcock.  The midportion of the underlying mesosalpinx was then opened with cautery.  The proximal and distal portion of the tube was tied with 0 chromic sutures x2 proximally and distally.  Intervening segment of  fallopian tube was then removed. The same procedure was performed on the opposite side after identifying the fimbriated end of the tube.  The abdomen had been irrigated and suctioned of debris.  Interceed was placed in the lower uterine segment in an inverted T fashion.  The self-retaining retractor was then removed.  The parietal peritoneum was closed with 2-0 Vicryl.  The rectus fascia was closed with 0 Vicryl x2.  The subcutaneous area was irrigated.  Small bleeders cauterized.  Interrupted 2-0 plain sutures placed, and the skin approximated with Ethicon staples.  SPECIMEN:  Portion of right and left fallopian tubes sent to Pathology. The placenta was not sent.  ESTIMATED BLOOD LOSS:  1 L.  INTRAOPERATIVE FLUID:  3 L.  URINE OUTPUT:  100 mL.  Clear yellow urine.  SPONGE AND INSTRUMENT COUNT:  Counts x2 was correct.  COMPLICATION:  None.  The patient tolerated the procedure well, and was transferred to recovery room in stable condition.      Maxie BetterSheronette Jaydalynn Olivero, M.D.   ______________________________ Maxie BetterSheronette Sumayah Bearse, M.D.    /MEDQ  D:  04/09/2016  T:  04/09/2016  Job:  [id number]

## 2016-05-16 HISTORY — PX: BARIATRIC SURGERY: SHX1103

## 2016-08-04 ENCOUNTER — Other Ambulatory Visit: Payer: Self-pay | Admitting: Surgical Oncology

## 2016-08-04 DIAGNOSIS — Z9989 Dependence on other enabling machines and devices: Secondary | ICD-10-CM

## 2016-08-04 DIAGNOSIS — G4733 Obstructive sleep apnea (adult) (pediatric): Secondary | ICD-10-CM

## 2016-08-04 DIAGNOSIS — M170 Bilateral primary osteoarthritis of knee: Secondary | ICD-10-CM

## 2016-08-15 ENCOUNTER — Ambulatory Visit
Admission: RE | Admit: 2016-08-15 | Discharge: 2016-08-15 | Disposition: A | Discharge status: Home / Self Care | Payer: BLUE CROSS/BLUE SHIELD | Source: Ambulatory Visit | Attending: Surgical Oncology | Admitting: Surgical Oncology

## 2016-08-15 DIAGNOSIS — M170 Bilateral primary osteoarthritis of knee: Secondary | ICD-10-CM

## 2016-08-15 DIAGNOSIS — Z9989 Dependence on other enabling machines and devices: Secondary | ICD-10-CM

## 2016-08-15 DIAGNOSIS — G4733 Obstructive sleep apnea (adult) (pediatric): Secondary | ICD-10-CM

## 2017-12-25 ENCOUNTER — Other Ambulatory Visit: Payer: Self-pay | Admitting: Obstetrics and Gynecology

## 2017-12-29 ENCOUNTER — Encounter (HOSPITAL_COMMUNITY): Payer: Self-pay | Admitting: *Deleted

## 2017-12-29 ENCOUNTER — Other Ambulatory Visit: Payer: Self-pay

## 2018-01-01 ENCOUNTER — Encounter (HOSPITAL_COMMUNITY): Payer: Self-pay | Admitting: *Deleted

## 2018-01-02 ENCOUNTER — Ambulatory Visit (HOSPITAL_COMMUNITY)
Admission: RE | Admit: 2018-01-02 | Discharge: 2018-01-02 | Disposition: A | Discharge status: Home / Self Care | Payer: BLUE CROSS/BLUE SHIELD | Source: Ambulatory Visit | Attending: Obstetrics and Gynecology | Admitting: Obstetrics and Gynecology

## 2018-01-02 ENCOUNTER — Encounter (HOSPITAL_COMMUNITY)
Admission: RE | Disposition: A | Discharge status: Home / Self Care | Payer: Self-pay | Source: Ambulatory Visit | Attending: Obstetrics and Gynecology

## 2018-01-02 ENCOUNTER — Other Ambulatory Visit: Payer: Self-pay

## 2018-01-02 ENCOUNTER — Ambulatory Visit (HOSPITAL_COMMUNITY): Payer: BLUE CROSS/BLUE SHIELD | Admitting: Certified Registered Nurse Anesthetist

## 2018-01-02 ENCOUNTER — Encounter (HOSPITAL_COMMUNITY): Payer: Self-pay

## 2018-01-02 DIAGNOSIS — Z880 Allergy status to penicillin: Secondary | ICD-10-CM | POA: Diagnosis not present

## 2018-01-02 DIAGNOSIS — Z888 Allergy status to other drugs, medicaments and biological substances status: Secondary | ICD-10-CM | POA: Diagnosis not present

## 2018-01-02 DIAGNOSIS — Z6839 Body mass index (BMI) 39.0-39.9, adult: Secondary | ICD-10-CM | POA: Diagnosis not present

## 2018-01-02 DIAGNOSIS — N840 Polyp of corpus uteri: Secondary | ICD-10-CM | POA: Insufficient documentation

## 2018-01-02 DIAGNOSIS — G4730 Sleep apnea, unspecified: Secondary | ICD-10-CM | POA: Diagnosis not present

## 2018-01-02 DIAGNOSIS — F419 Anxiety disorder, unspecified: Secondary | ICD-10-CM | POA: Diagnosis not present

## 2018-01-02 DIAGNOSIS — K219 Gastro-esophageal reflux disease without esophagitis: Secondary | ICD-10-CM | POA: Insufficient documentation

## 2018-01-02 DIAGNOSIS — M1990 Unspecified osteoarthritis, unspecified site: Secondary | ICD-10-CM | POA: Diagnosis not present

## 2018-01-02 DIAGNOSIS — Z9884 Bariatric surgery status: Secondary | ICD-10-CM | POA: Insufficient documentation

## 2018-01-02 DIAGNOSIS — N920 Excessive and frequent menstruation with regular cycle: Secondary | ICD-10-CM | POA: Insufficient documentation

## 2018-01-02 DIAGNOSIS — Z79899 Other long term (current) drug therapy: Secondary | ICD-10-CM | POA: Diagnosis not present

## 2018-01-02 HISTORY — PX: HYSTEROSCOPY WITH NOVASURE: SHX5574

## 2018-01-02 HISTORY — DX: Anemia, unspecified: D64.9

## 2018-01-02 HISTORY — PX: DILATATION & CURETTAGE/HYSTEROSCOPY WITH MYOSURE: SHX6511

## 2018-01-02 HISTORY — DX: Unspecified osteoarthritis, unspecified site: M19.90

## 2018-01-02 LAB — CBC
HCT: 38.5 % (ref 36.0–46.0)
HEMOGLOBIN: 12.4 g/dL (ref 12.0–15.0)
MCH: 28.1 pg (ref 26.0–34.0)
MCHC: 32.2 g/dL (ref 30.0–36.0)
MCV: 87.3 fL (ref 78.0–100.0)
Platelets: 273 10*3/uL (ref 150–400)
RBC: 4.41 MIL/uL (ref 3.87–5.11)
RDW: 13.9 % (ref 11.5–15.5)
WBC: 6.8 10*3/uL (ref 4.0–10.5)

## 2018-01-02 SURGERY — DILATATION & CURETTAGE/HYSTEROSCOPY WITH MYOSURE
Anesthesia: General | Site: Vagina

## 2018-01-02 MED ORDER — KETOROLAC TROMETHAMINE 30 MG/ML IJ SOLN: 30.0000 mg | Freq: Once | INTRAMUSCULAR | Status: DC | PRN

## 2018-01-02 MED ORDER — LIDOCAINE HCL (CARDIAC) PF 100 MG/5ML IV SOSY
PREFILLED_SYRINGE | INTRAVENOUS | Status: AC
  Filled 2018-01-02: qty 5

## 2018-01-02 MED ORDER — ONDANSETRON HCL 4 MG/2ML IJ SOLN
INTRAMUSCULAR | Status: DC | PRN
  Administered 2018-01-02: 4 mg via INTRAVENOUS

## 2018-01-02 MED ORDER — METOCLOPRAMIDE HCL 5 MG/ML IJ SOLN
INTRAMUSCULAR | Status: AC
  Administered 2018-01-02: 10 mg via INTRAVENOUS
  Filled 2018-01-02: qty 2

## 2018-01-02 MED ORDER — FENTANYL CITRATE (PF) 100 MCG/2ML IJ SOLN
25.0000 ug | INTRAMUSCULAR | Status: DC | PRN
  Administered 2018-01-02: 50 ug via INTRAVENOUS

## 2018-01-02 MED ORDER — DEXAMETHASONE SODIUM PHOSPHATE 4 MG/ML IJ SOLN
INTRAMUSCULAR | Status: AC
  Filled 2018-01-02: qty 1

## 2018-01-02 MED ORDER — FENTANYL CITRATE (PF) 100 MCG/2ML IJ SOLN
INTRAMUSCULAR | Status: DC | PRN
  Administered 2018-01-02: 100 ug via INTRAVENOUS

## 2018-01-02 MED ORDER — IBUPROFEN 100 MG/5ML PO SUSP
200.0000 mg | Freq: Four times a day (QID) | ORAL | Status: DC | PRN
  Filled 2018-01-02: qty 20

## 2018-01-02 MED ORDER — ONDANSETRON HCL 4 MG/2ML IJ SOLN
4.0000 mg | Freq: Once | INTRAMUSCULAR | Status: AC | PRN
  Administered 2018-01-02: 4 mg via INTRAVENOUS

## 2018-01-02 MED ORDER — FENTANYL CITRATE (PF) 100 MCG/2ML IJ SOLN
INTRAMUSCULAR | Status: AC
  Filled 2018-01-02: qty 2

## 2018-01-02 MED ORDER — PROPOFOL 10 MG/ML IV BOLUS
INTRAVENOUS | Status: AC
  Filled 2018-01-02: qty 20

## 2018-01-02 MED ORDER — MEPERIDINE HCL 25 MG/ML IJ SOLN: 6.2500 mg | INTRAMUSCULAR | Status: DC | PRN

## 2018-01-02 MED ORDER — SCOPOLAMINE 1 MG/3DAYS TD PT72
1.0000 | MEDICATED_PATCH | Freq: Once | TRANSDERMAL | Status: DC
  Administered 2018-01-02: 1.5 mg via TRANSDERMAL

## 2018-01-02 MED ORDER — HYDROCODONE-ACETAMINOPHEN 5-325 MG PO TABS: 1.0000 | ORAL_TABLET | Freq: Four times a day (QID) | ORAL | 0 refills | Status: AC | PRN

## 2018-01-02 MED ORDER — DEXAMETHASONE SODIUM PHOSPHATE 10 MG/ML IJ SOLN
INTRAMUSCULAR | Status: DC | PRN
  Administered 2018-01-02: 4 mg via INTRAVENOUS

## 2018-01-02 MED ORDER — EPHEDRINE SULFATE 50 MG/ML IJ SOLN
INTRAMUSCULAR | Status: DC | PRN
  Administered 2018-01-02: 5 mg via INTRAVENOUS

## 2018-01-02 MED ORDER — MIDAZOLAM HCL 2 MG/2ML IJ SOLN
INTRAMUSCULAR | Status: DC | PRN
  Administered 2018-01-02: 2 mg via INTRAVENOUS

## 2018-01-02 MED ORDER — OXYCODONE HCL 5 MG/5ML PO SOLN: 5.0000 mg | Freq: Once | ORAL | Status: DC | PRN

## 2018-01-02 MED ORDER — PROPOFOL 10 MG/ML IV BOLUS
INTRAVENOUS | Status: DC | PRN
  Administered 2018-01-02: 200 mg via INTRAVENOUS

## 2018-01-02 MED ORDER — IBUPROFEN 200 MG PO TABS
200.0000 mg | ORAL_TABLET | Freq: Four times a day (QID) | ORAL | Status: DC | PRN
  Filled 2018-01-02: qty 2

## 2018-01-02 MED ORDER — SODIUM CHLORIDE 0.9 % IR SOLN
Status: DC | PRN
  Administered 2018-01-02: 3000 mL

## 2018-01-02 MED ORDER — MIDAZOLAM HCL 2 MG/2ML IJ SOLN
INTRAMUSCULAR | Status: AC
  Filled 2018-01-02: qty 2

## 2018-01-02 MED ORDER — ONDANSETRON HCL 4 MG/2ML IJ SOLN
INTRAMUSCULAR | Status: AC
  Filled 2018-01-02: qty 2

## 2018-01-02 MED ORDER — EPHEDRINE 5 MG/ML INJ
INTRAVENOUS | Status: AC
  Filled 2018-01-02: qty 10

## 2018-01-02 MED ORDER — METOCLOPRAMIDE HCL 5 MG/ML IJ SOLN
10.0000 mg | Freq: Once | INTRAMUSCULAR | Status: AC | PRN
  Administered 2018-01-02: 10 mg via INTRAVENOUS

## 2018-01-02 MED ORDER — LACTATED RINGERS IV SOLN
INTRAVENOUS | Status: DC
  Administered 2018-01-02: 125 mL/h via INTRAVENOUS
  Administered 2018-01-02: 12:00:00 via INTRAVENOUS

## 2018-01-02 MED ORDER — LIDOCAINE HCL (CARDIAC) PF 100 MG/5ML IV SOSY
PREFILLED_SYRINGE | INTRAVENOUS | Status: DC | PRN
  Administered 2018-01-02: 100 mg via INTRAVENOUS

## 2018-01-02 MED ORDER — SCOPOLAMINE 1 MG/3DAYS TD PT72
MEDICATED_PATCH | TRANSDERMAL | Status: AC
  Administered 2018-01-02: 1.5 mg via TRANSDERMAL
  Filled 2018-01-02: qty 1

## 2018-01-02 MED ORDER — OXYCODONE HCL 5 MG PO TABS: 5.0000 mg | ORAL_TABLET | Freq: Once | ORAL | Status: DC | PRN

## 2018-01-02 MED ORDER — KETOROLAC TROMETHAMINE 30 MG/ML IJ SOLN
INTRAMUSCULAR | Status: AC
  Filled 2018-01-02: qty 1

## 2018-01-02 SURGICAL SUPPLY — 16 items
ABLATOR SURESOUND NOVASURE (ABLATOR) ×2 IMPLANT
CANISTER SUCT 3000ML PPV (MISCELLANEOUS) ×2 IMPLANT
CATH ROBINSON RED A/P 16FR (CATHETERS) ×2 IMPLANT
DEVICE MYOSURE LITE (MISCELLANEOUS) IMPLANT
DEVICE MYOSURE REACH (MISCELLANEOUS) IMPLANT
FILTER ARTHROSCOPY CONVERTOR (FILTER) ×2 IMPLANT
GLOVE BIOGEL PI IND STRL 7.0 (GLOVE) ×2 IMPLANT
GLOVE BIOGEL PI INDICATOR 7.0 (GLOVE) ×2
GLOVE ECLIPSE 6.5 STRL STRAW (GLOVE) ×2 IMPLANT
GOWN STRL REUS W/TWL LRG LVL3 (GOWN DISPOSABLE) ×4 IMPLANT
PACK VAGINAL MINOR WOMEN LF (CUSTOM PROCEDURE TRAY) ×2 IMPLANT
PAD OB MATERNITY 4.3X12.25 (PERSONAL CARE ITEMS) ×2 IMPLANT
SEAL ROD LENS SCOPE MYOSURE (ABLATOR) ×2 IMPLANT
TOWEL OR 17X24 6PK STRL BLUE (TOWEL DISPOSABLE) ×4 IMPLANT
TUBING AQUILEX INFLOW (TUBING) ×2 IMPLANT
TUBING AQUILEX OUTFLOW (TUBING) ×2 IMPLANT

## 2018-01-02 NOTE — H&P (Signed)
Katie Katie [name] is an 42 y.o. female. Presents for surgical management of menorrhagia. Pt is scheduled for dx hysteroscopy, novasure endometrial ablation. endom bx prolif endometrium  Pertinent Gynecological History: Menses: menorrhagia Bleeding: menorrhagia CMenstrual History: Menarche age: n/Katie [name] Patient's last menstrual period was 12/20/2017 (exact date).    Past Medical History:  Diagnosis Date  . AMA (advanced maternal age) multigravida 35+   . Anemia    WITH PREGNANCY  . Anxiety   . Arthritis    OSTEOARTHRITIS  . Asthma    as Katie [name] child  . Carpal tunnel syndrome    only first pregnancy  . Gestational diabetes   . Morbid obesity (HCC)   . PONV (postoperative nausea and vomiting)   . Sleep apnea    USES C-PAP    Past Surgical History:  Procedure Laterality Date  . BARIATRIC SURGERY  2018  . CESAREAN SECTION WITH BILATERAL TUBAL LIGATION Bilateral 04/08/2016   Procedure: CESAREAN SECTION WITH BILATERAL TUBAL LIGATION;  Surgeon: Maxie BetterSheronette Amen Staszak, MD;  Location: [facility];  Service: Obstetrics;  Laterality: Bilateral;  . CHOLECYSTECTOMY     2002  . KNEE SURGERY Right 03/19/2014   orthoscopic arthroscopy  . TONSILLECTOMY AND ADENOIDECTOMY      Family History  Problem Relation Age of Onset  . Hypertension Mother   . Thyroid disease Mother   . Migraines Mother   . Diabetes Mother   . Multiple sclerosis Mother   . Diabetes Maternal Grandfather   . Anesthesia problems Neg Hx   . Hypotension Neg Hx   . Malignant hyperthermia Neg Hx   . Pseudochol deficiency Neg Hx     Social History:  reports that she has never smoked. She has never used smokeless tobacco. She reports that she drank alcohol. She reports that she does not use drugs.  Allergies:  Allergies  Allergen Reactions  . Anesthesia S-I-60 Nausea And Vomiting    "Abnormally sick"  . Penicillins Hives    Has patient had Katie [name] PCN reaction causing immediate rash, facial/tongue/throat swelling, SOB or  lightheadedness with hypotension: no Has patient had Katie [name] PCN reaction causing severe rash involving mucus membranes or skin necrosis: no Has patient had Katie [name] PCN reaction that required hospitalization no Has patient had Katie [name] PCN reaction occurring within the last 10 years: yes If all of the above answers are "NO", then may proceed with Cephalosporin use.     Medications Prior to Admission  Medication Sig Dispense Refill Last Dose  . acetaminophen (TYLENOL) 325 MG tablet Take 1 tablet (325 mg total) by mouth every 4 (four) hours as needed for mild pain.   Past Month at Unknown time  . Calcium 200 MG TABS Take 400 mg by mouth 3 (three) times daily.   01/01/2018 at Unknown time  . Cholecalciferol (VITAMIN D-3 PO) Take 125 mcg by mouth daily.   01/01/2018 at Unknown time  . cyanocobalamin 100 MCG tablet Take 333 mcg by mouth daily.     . ferrous sulfate 325 (65 FE) MG tablet Take 325 mg by mouth daily with breakfast.   01/01/2018 at Unknown time  . misoprostol (CYTOTEC) 200 MCG tablet Place 200 mcg vaginally once.   01/02/2018 at 800  . Multiple Vitamin (MULTIVITAMIN WITH MINERALS) TABS tablet Take 1 tablet by mouth daily.   01/01/2018 at Unknown time  . omeprazole (PRILOSEC) 20 MG capsule Take 20 mg by mouth daily.  1 01/01/2018 at 0900  . sertraline (ZOLOFT) 100 MG tablet Take 100 mg by mouth  every evening.    01/01/2018 at 2200  . Alfalfa 250 MG TABS Take 2 tablets (500 mg total) by mouth 4 (four) times daily. Start with 2 tabs/day and increase by 2 tabs daily until 8 tabs/day (Patient not taking: Reported on 12/25/2017)  0 Not Taking at Unknown time  . coconut oil OIL Apply 1 application topically as needed. (Patient not taking: Reported on 12/25/2017)  0 Not Taking at Unknown time  . ibuprofen (ADVIL,MOTRIN) 600 MG tablet Take 1 tablet (600 mg total) by mouth every 6 (six) hours. (Patient not taking: Reported on 12/25/2017) 30 tablet 0 Not Taking at Unknown time  . iron polysaccharides (NIFEREX) 150 MG capsule  Take 1 capsule (150 mg total) by mouth 2 (two) times daily. (Patient not taking: Reported on 12/25/2017) 90 capsule 1 Not Taking at Unknown time  . magnesium oxide (MAG-OX) 400 (241.3 Mg) MG tablet Take 1 tablet (400 mg total) by mouth daily. (Patient not taking: Reported on 12/25/2017)   Not Taking at Unknown time  . oxyCODONE-acetaminophen (PERCOCET/ROXICET) 5-325 MG tablet Take 2 tablets by mouth every 4 (four) hours as needed (pain scale > 7). (Patient not taking: Reported on 12/25/2017) 30 tablet 0 Not Taking at Unknown time  . senna-docusate (SENOKOT-S) 8.6-50 MG tablet Take 2 tablets by mouth daily. (Patient not taking: Reported on 12/25/2017)   Not Taking at Unknown time  . simethicone (MYLICON) 80 MG chewable tablet Chew 1 tablet (80 mg total) by mouth 3 (three) times daily after meals. (Patient not taking: Reported on 12/25/2017) 30 tablet 0 Not Taking at Unknown time    Review of Systems  All other systems reviewed and are negative.   Blood pressure 108/66, pulse (!) 56, temperature 98.4 F (36.9 C), temperature source Oral, resp. rate 16, height 5\' 3"  (1.6 m), weight 101.6 kg, last menstrual period 12/20/2017, SpO2 98 %, unknown if currently breastfeeding. Physical Exam  Constitutional: She is oriented to person, place, and time. She appears well-developed and well-nourished.  HENT:  Head: Atraumatic.  Neck: Neck supple.  Cardiovascular: Regular rhythm.  Respiratory: Breath sounds normal.  GI: Soft.  Obese (+) pannus. pfannenstiel skin  Genitourinary: Vagina normal and uterus normal.  Genitourinary Comments: Vulva nl Adnexa nonpalpable Cervix closed  Musculoskeletal: She exhibits edema.  Neurological: She is alert and oriented to person, place, and time.  Skin: Skin is warm and dry.  Psychiatric: She has Katie [name] normal mood and affect.    Results for orders placed or performed during the hospital encounter of 01/02/18 (from the past 24 hour(s))  CBC     Status: None   Collection  Time: 01/02/18 10:27 AM  Result Value Ref Range   WBC 6.8 4.0 - 10.5 K/uL   RBC 4.41 3.87 - 5.11 MIL/uL   Hemoglobin 12.4 12.0 - 15.0 g/dL   HCT 16.138.5 09.636.0 - 04.546.0 %   MCV 87.3 78.0 - 100.0 fL   MCH 28.1 26.0 - 34.0 pg   MCHC 32.2 30.0 - 36.0 g/dL   RDW 40.913.9 81.111.5 - 91.415.5 %   Platelets 273 150 - 400 K/uL    No results found.  Assessment/Plan:menorrhagia Morbid obesity P) dx hysteroscopy, novasure endometrial ablation. Risk of surgery includes infection, bleeding, uterine perforation and its risk(05/998), injury to surrounding organ structures, thermal injury failure rate 10%. All ? answered  Katie Katie [name] Katie Katie [name] 01/02/2018, 11:07 AM

## 2018-01-02 NOTE — Discharge Instructions (Signed)
DISCHARGE INSTRUCTIONS: D&C / D&E The following instructions have been prepared to help you care for yourself upon your return home.   Personal hygiene:  Use sanitary pads for vaginal drainage, not tampons.  Shower the day after your procedure.  NO tub baths, pools or Jacuzzis for 2-3 weeks.  Wipe front to back after using the bathroom.  Activity and limitations:  Do NOT drive or operate any equipment for 24 hours. The effects of anesthesia are still present and drowsiness may result.  Do NOT rest in bed all day.  Walking is encouraged.  Walk up and down stairs slowly.  You may resume your normal activity in one to two days or as indicated by your physician.  Sexual activity: NO intercourse for at least 2 weeks after the procedure, or as indicated by your physician.  Diet: Eat a light meal as desired this evening. You may resume your usual diet tomorrow.  Return to work: You may resume your work activities in one to two days or as indicated by your doctor.  What to expect after your surgery: Expect to have vaginal bleeding/discharge for 2-3 days and spotting for up to 10 days. It is not unusual to have soreness for up to 1-2 weeks. You may have a slight burning sensation when you urinate for the first day. Mild cramps may continue for a couple of days. You may have a regular period in 2-6 weeks.  Call your doctor for any of the following:  Excessive vaginal bleeding, saturating and changing one pad every hour.  Inability to urinate 6 hours after discharge from hospital.  Pain not relieved by pain medication.  Fever of 100.4 F or greater.  Unusual vaginal discharge or odor.   Call for an appointment:    Patients signature: ______________________  Nurses signature ________________________  Support person's signature_______________________   CALL  IF TEMP>100.4, NOTHING PER VAGINA X 2 WK, CALL IF SOAKING A MAXI  PAD EVERY HOUR OR MORE FREQUENTLY

## 2018-01-02 NOTE — Anesthesia Preprocedure Evaluation (Addendum)
Anesthesia Evaluation  Patient identified by MRN, date of birth, ID band Patient awake    Reviewed: Allergy & Precautions, NPO status , Patient's Chart, lab work & pertinent test results  History of Anesthesia Complications (+) PONV and history of anesthetic complications  Airway Mallampati: II       Dental no notable dental hx. (+) Teeth Intact   Pulmonary sleep apnea ,    Pulmonary exam normal breath sounds clear to auscultation       Cardiovascular Normal cardiovascular exam Rhythm:Regular Rate:Normal     Neuro/Psych PSYCHIATRIC DISORDERS Anxiety    GI/Hepatic GERD  Medicated,  Endo/Other  diabetesMorbid obesity  Renal/GU      Musculoskeletal   Abdominal (+) + obese,   Peds  Hematology   Anesthesia Other Findings   Reproductive/Obstetrics                           Anesthesia Physical Anesthesia Plan  ASA: III  Anesthesia Plan: General   Post-op Pain Management:    Induction: Intravenous  PONV Risk Score and Plan: 4 or greater and Ondansetron, Dexamethasone, Scopolamine patch - Pre-op and Midazolam  Airway Management Planned: LMA  Additional Equipment:   Intra-op Plan:   Post-operative Plan: Extubation in OR  Informed Consent: I have reviewed the patients History and Physical, chart, labs and discussed the procedure including the risks, benefits and alternatives for the proposed anesthesia with the patient or authorized representative who has indicated his/her understanding and acceptance.   Dental advisory given  Plan Discussed with:   Anesthesia Plan Comments: (Proseal LMA)       Anesthesia Quick Evaluation

## 2018-01-02 NOTE — Op Note (Signed)
NAME: Katie [name], Katie M. MEDICAL RECORD [id number] ACCOUNT [id number] DATE OF BIRTH:01-May-1976 FACILITY: [facility] LOCATION: [facility] PHYSICIAN:Demere Dotzler A. Adonna Horsley, MD  OPERATIVE REPORT  DATE OF PROCEDURE:  01/02/2018  PREOPERATIVE DIAGNOSES:  Menorrhagia with regular cycles, previous cesarean section, morbid obesity.  PROCEDURE PERFORMED:  Diagnostic hysteroscopy, dilation and curettage, NovaSure endometrial ablation.  POSTOPERATIVE DIAGNOSES:  Menorrhagia with regular cycles, previous cesarean section, morbid obesity, and endometrial polyp.  ANESTHESIA:  General.  SURGEON:  Maxie BetterSheronette Deamonte Sayegh, MD  ASSISTANT:  None.  DESCRIPTION OF PROCEDURE:  Under adequate general anesthesia, the patient was placed in the dorsal lithotomy position.  She was sterilely prepped and draped in usual fashion.  The bladder was catheterized, a moderate amount of urine.  Examination under anesthesia revealed an anteverted, slightly enlarged uterus.  No adnexal masses could be appreciated but limited by the patient's body habitus.  Bivalve speculum was placed in the vagina.  A single-tooth tenaculum was placed on the anterior lip of the  cervix.  The uterus sounded to 8.5 cm.  Endocervical canal sounded to 3 cm, given a cavity length of 5.5 cm.  The diagnostic hysteroscope was introduced into the uterine cavity.  Both tubal ostia could be seen.  There was a polypoid lesion at the distal end of the endocervical canal left lower uterine segment.  The hysteroscope was removed.  The cavity was then gently curetted with subsequent removal of the polyp along with endometriosis.  The NovaSure endometrial ablative apparatus was inserted.  A  cavity width of 4.5 cm was noted.  Using 136 watts of power, 38 seconds of endometrial ablation occurred.  The apparatus was removed.  The diagnostic hysteroscope was reinserted, and the cavity had good ablation throughout.  The procedure was felt to be complete and, therefore,  all instruments were then removed from the vagina.  SPECIMEN:  Labeled endometrial curettings with polyp sent to pathology.  ESTIMATED BLOOD LOSS:  Minimal.  FLUID DEFICIT:  300.  COMPLICATIONS:  None.  The patient tolerated the procedure well and was transferred to recovery in stable condition.  LN/NUANCE  D:01/02/2018 T:01/02/2018 JOB:[id number]

## 2018-01-02 NOTE — Brief Op Note (Signed)
01/02/2018  12:48 PM  PATIENT:  Katie [name]  42 y.o. female  PRE-OPERATIVE DIAGNOSIS:  Menorrhagia,  Hx cesarean section, morbid obesity  POST-OPERATIVE DIAGNOSIS:  Menorrhagia, endometrial polyp, morbid obesity, hx cesarean section  PROCEDURE:  Diagnostic hysteroscopy, D&C, novasure endometrial ablation  SURGEON:  Surgeon(s) and Role:    * Maxie Betterousins, Taz Vanness, MD - Primary  PHYSICIAN ASSISTANT:   ASSISTANTS: none   ANESTHESIA:   general FINDING; small endom polyp, nl tubal ostia EBL:  5 mL   BLOOD ADMINISTERED:none  DRAINS: none   LOCAL MEDICATIONS USED:  NONE  SPECIMEN:  Source of Specimen:  emc with polyp  DISPOSITION OF SPECIMEN:  PATHOLOGY  COUNTS:  YES  TOURNIQUET:  * No tourniquets in log *  DICTATION: .Other Dictation: Dictation Number [id number]  PLAN OF CARE: Discharge to home after PACU  PATIENT DISPOSITION:  PACU - hemodynamically stable.   Delay start of Pharmacological VTE agent (>24hrs) due to surgical blood loss or risk of bleeding: no

## 2018-01-02 NOTE — Transfer of Care (Signed)
Immediate Anesthesia Transfer of Care Note  Patient: Katie [name]  Procedure(s) Performed: DILATATION & CURETTAGE/HYSTEROSCOPY (N/A Vagina ) HYSTEROSCOPY WITH NOVASURE (N/A Vagina )  Patient Location: PACU  Anesthesia Type:General  Level of Consciousness: awake, alert  and oriented  Airway & Oxygen Therapy: Patient Spontanous Breathing and Patient connected to nasal cannula oxygen  Post-op Assessment: Report given to RN, Post -op Vital signs reviewed and stable and Patient moving all extremities  Post vital signs: Reviewed and stable  Last Vitals:  Vitals Value Taken Time  BP    Temp    Pulse    Resp 30 01/02/2018 12:31 PM  SpO2    Vitals shown include unvalidated device data.  Last Pain:  Vitals:   01/02/18 1041  TempSrc: Oral  PainSc: 1       Patients Stated Pain Goal: 5 (01/02/18 1041)  Complications: No apparent anesthesia complications

## 2018-01-02 NOTE — Anesthesia Postprocedure Evaluation (Signed)
Anesthesia Post Note  Patient: Katie [name]  Procedure(s) Performed: DILATATION & CURETTAGE/HYSTEROSCOPY (N/A Vagina ) HYSTEROSCOPY WITH NOVASURE (N/A Vagina )     Patient location during evaluation: PACU Anesthesia Type: General Level of consciousness: awake Pain management: pain level controlled Vital Signs Assessment: post-procedure vital signs reviewed and stable Respiratory status: spontaneous breathing Cardiovascular status: stable Postop Assessment: no apparent nausea or vomiting Anesthetic complications: no    Last Vitals:  Vitals:   01/02/18 1330 01/02/18 1345  BP: (!) 119/59 113/65  Pulse: 68 64  Resp: 10 19  Temp:    SpO2: 97% 95%    Last Pain:  Vitals:   01/02/18 1330  TempSrc:   PainSc: 0-No pain   Pain Goal: Patients Stated Pain Goal: 5 (01/02/18 1041)               Kaylan Friedmann JR,JOHN Susann GivensFRANKLIN

## 2018-01-02 NOTE — Anesthesia Procedure Notes (Signed)
Procedure Name: LMA Insertion Date/Time: 01/02/2018 11:47 AM Performed by: Elgie CongoMalinova, Delmar Dondero H, CRNA Pre-anesthesia Checklist: Patient identified, Emergency Drugs available, Suction available and Patient being monitored Patient Re-evaluated:Patient Re-evaluated prior to induction Oxygen Delivery Method: Circle system utilized Preoxygenation: Pre-oxygenation with 100% oxygen Induction Type: IV induction Ventilation: Mask ventilation without difficulty LMA: LMA inserted LMA Size: 4.0 Number of attempts: 1 Placement Confirmation: positive ETCO2 and breath sounds checked- equal and bilateral Tube secured with: Tape Dental Injury: Teeth and Oropharynx as per pre-operative assessment

## 2018-01-03 ENCOUNTER — Encounter (HOSPITAL_COMMUNITY): Payer: Self-pay | Admitting: Obstetrics and Gynecology

## 2018-06-25 ENCOUNTER — Other Ambulatory Visit: Payer: Self-pay | Admitting: Physician Assistant

## 2018-06-25 DIAGNOSIS — K432 Incisional hernia without obstruction or gangrene: Secondary | ICD-10-CM

## 2018-06-29 ENCOUNTER — Other Ambulatory Visit: Payer: Self-pay | Admitting: Physician Assistant

## 2018-07-02 ENCOUNTER — Ambulatory Visit
Admission: RE | Admit: 2018-07-02 | Discharge: 2018-07-02 | Disposition: A | Discharge status: Home / Self Care | Payer: 59 | Source: Ambulatory Visit | Attending: Physician Assistant | Admitting: Physician Assistant

## 2018-07-02 DIAGNOSIS — K432 Incisional hernia without obstruction or gangrene: Secondary | ICD-10-CM

## 2018-10-08 IMAGING — US US MFM OB DETAIL+14 WK
1 series · 14 of 28 positions shown · non-contrast
Comparison: none

[Series 1: us mfm ob detail+14 wk · 80 acquisitions, 14 frames shown]
[im 3/80]
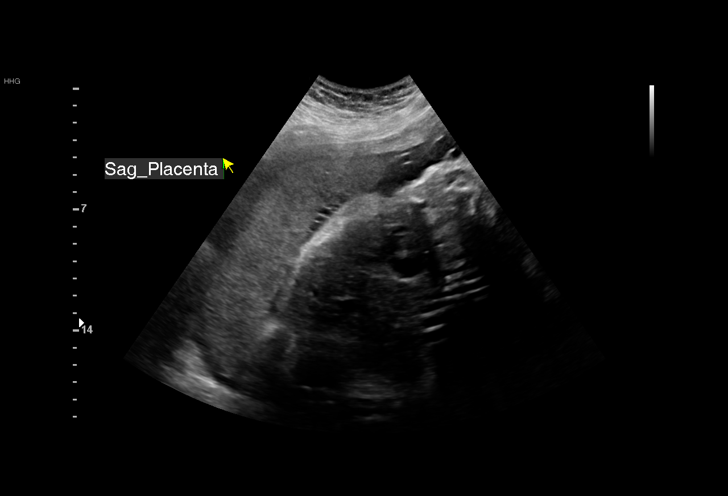
[im 9/80]
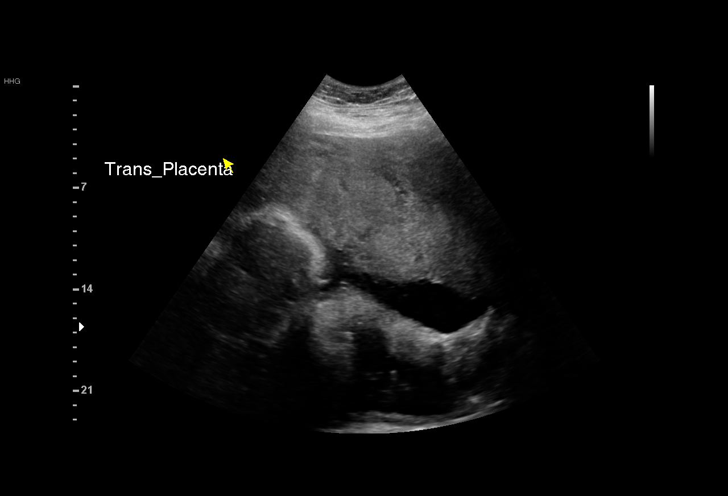
[im 15/80]
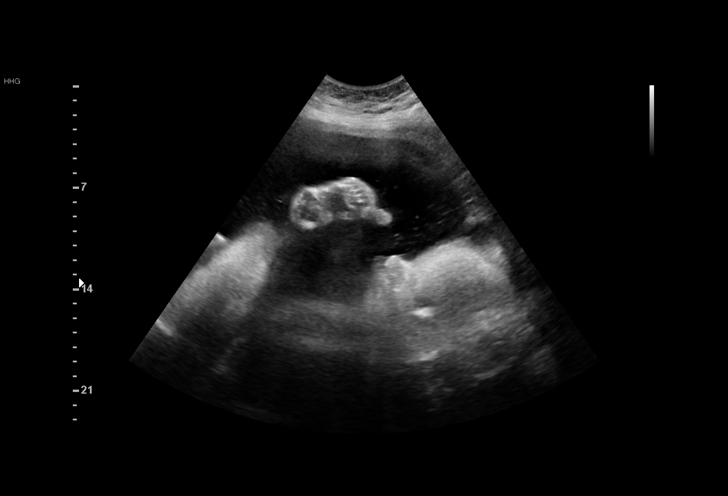
[im 21/80]
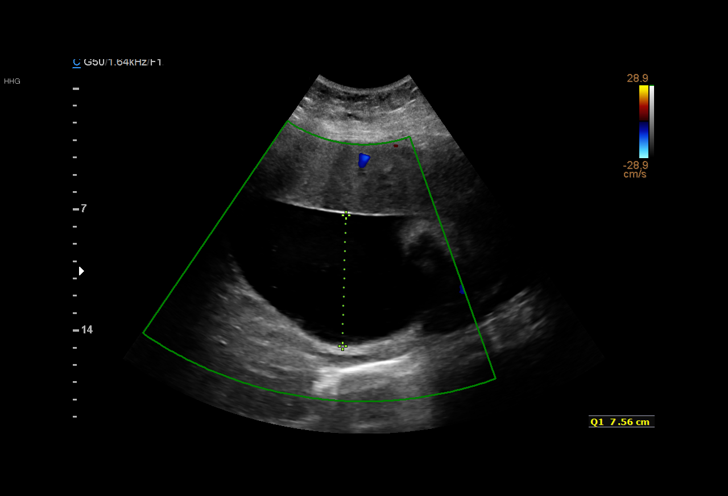
[im 27/80]
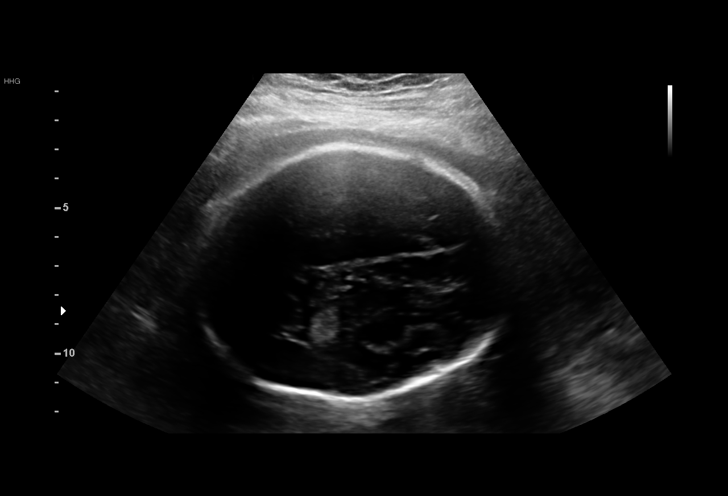
[im 33/80]
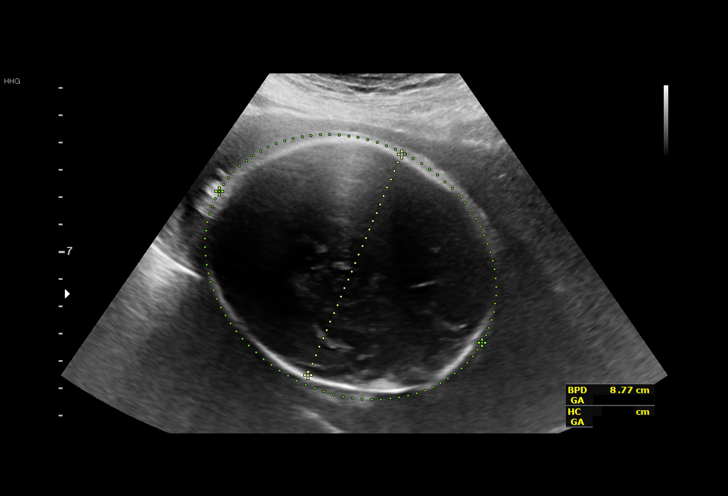
[im 39/80]
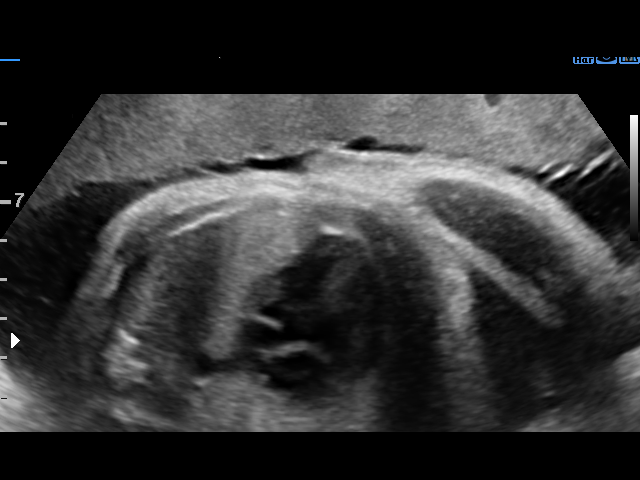
[im 44/80]
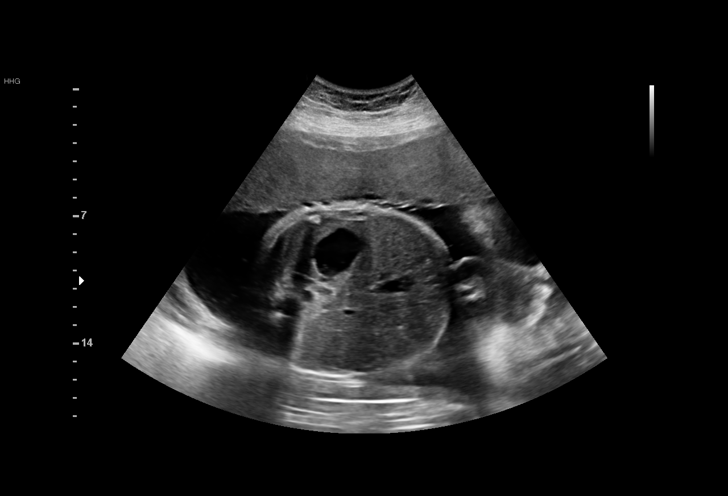
[im 50/80]
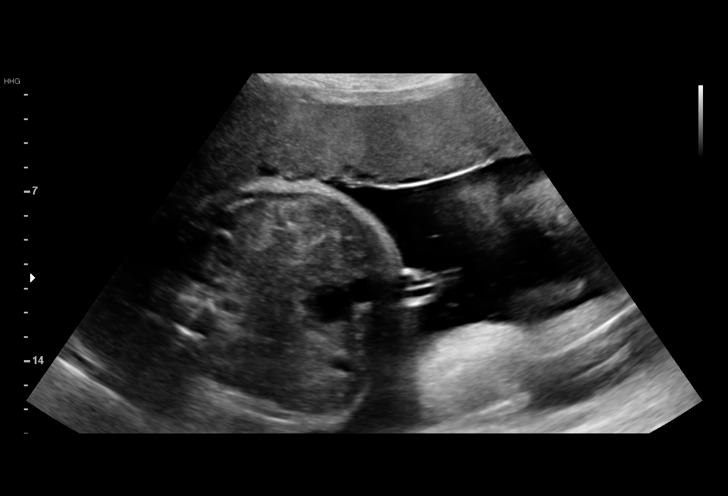
[im 56/80]
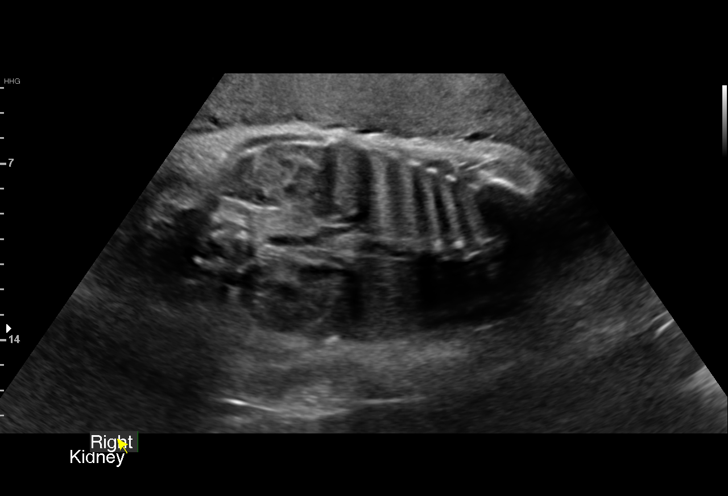
[im 62/80]
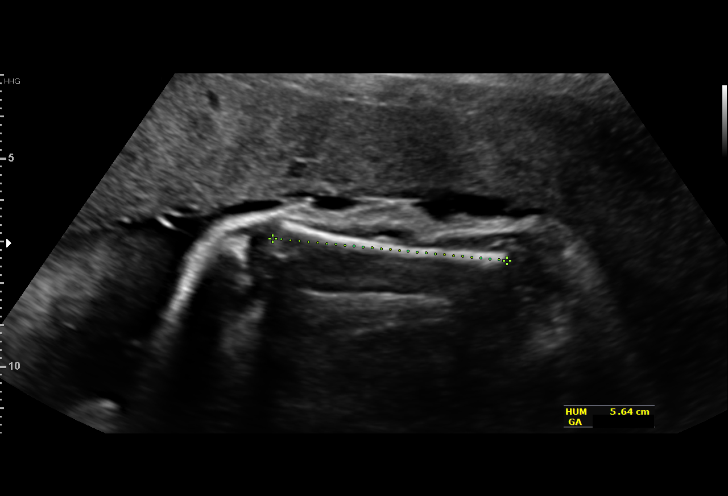
[im 68/80]
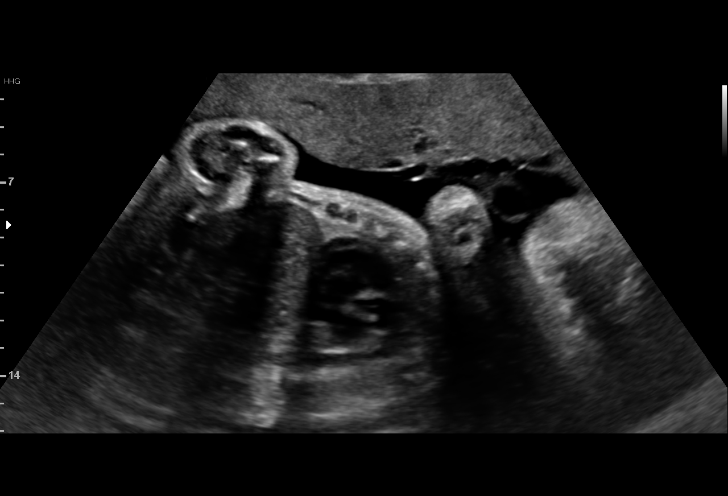
[im 74/80]
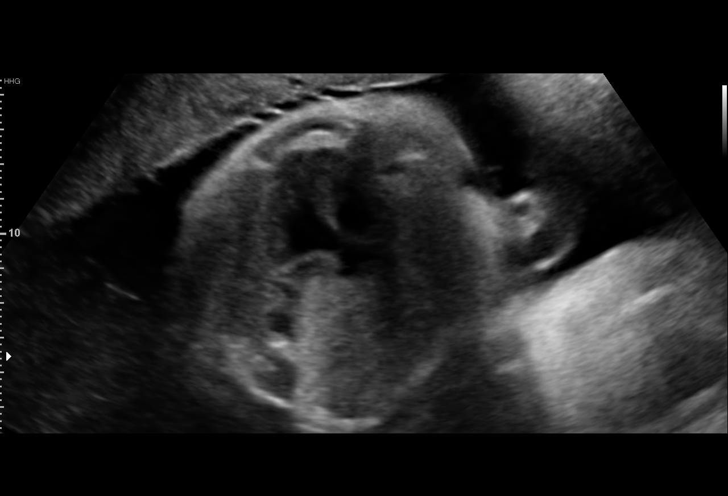
[im 80/80]
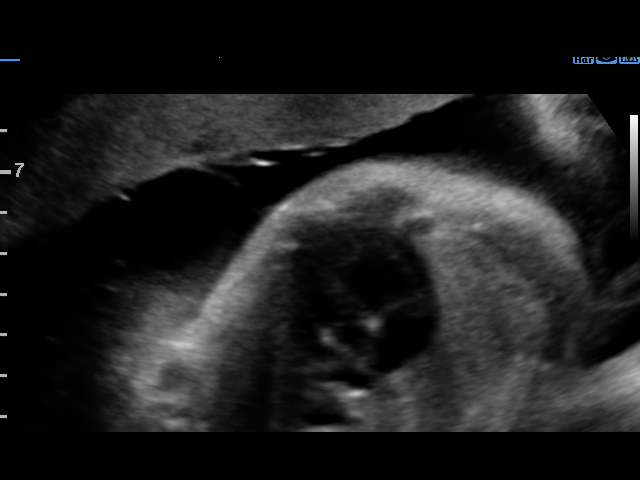

[14 of 28 positions shown; findings below may reference images not displayed]

[facility]

1  NZEZA               [id number]      [id number]     [id number]
DARELL
Indications

33 weeks gestation of pregnancy
Maternal morbid obesity
Gestational diabetes in pregnancy, insulin
controlled
Advanced maternal age multigravida 35+,
third trimester
Asthma                                         L66.L6 [id number]
OB History

Blood Type:            Height:  5'3"   Weight (lb):  350      BMI:
Gravidity:    3         Term:   2
Living:       2
Fetal Evaluation

Num Of Fetuses:     1
Fetal Heart         161
Rate(bpm):
Cardiac Activity:   Observed
Presentation:       Cephalic
Placenta:           Anterior, above cervical os
P. Cord Insertion:  Visualized, central

Amniotic Fluid
AFI FV:      Polyhydramnios

AFI Sum(cm)     %Tile       Largest Pocket(cm)
34.33           > 97
RUQ(cm)       RLQ(cm)       LUQ(cm)        LLQ(cm)
7.56
Biometry

BPD:      86.6  mm     G. Age:  35w 0d         91  %    CI:        71.32   %   70 - 86
FL/HC:      19.8   %   19.9 -
HC:      326.6  mm     G. Age:  37w 0d         96  %    HC/AC:      0.95       0.96 -
AC:      344.6  mm     G. Age:  38w 3d       > 97  %    FL/BPD:     74.7   %   71 - 87
FL:       64.7  mm     G. Age:  33w 3d         49  %    FL/AC:      18.8   %   20 - 24
HUM:        57  mm     G. Age:  33w 1d         61  %

LV:        5.5  mm

Est. FW:    7555  gm    6 lb 10 oz    > 90  %
Gestational Age

LMP:           37w 1d       Date:   06/11/15                 EDD:   03/17/16
Clinical EDD:  33w 0d                                        EDD:   04/15/16
U/S Today:     36w 0d                                        EDD:   03/25/16
Best:          33w 0d    Det. By:   Clinical EDD             EDD:   04/15/16
Anatomy

Cranium:               Appears normal         Aortic Arch:            Appears normal
Cavum:                 Appears normal         Ductal Arch:            Appears normal
Ventricles:            Appears normal         Diaphragm:              Appears normal
Choroid Plexus:        Appears normal         Stomach:                Appears normal, left
sided
Cerebellum:            Not well visualized    Abdomen:                Appears normal
Posterior Fossa:       Not well visualized    Abdominal Wall:         Appears nml (cord
insert, abd wall)
Nuchal Fold:           Not applicable (>20    Cord Vessels:           Appears normal (3
wks GA)                                        vessel cord)
Face:                  Appears normal         Kidneys:                Appear normal
(orbits and profile)
Lips:                  Appears normal         Bladder:                Appears normal
Thoracic:              Appears normal         Spine:                  Not well visualized
Heart:                 Appears normal         Upper Extremities:      Visualized
(4CH, axis, and situs
RVOT:                  Appears normal         Lower Extremities:      Visualized
LVOT:                  Appears normal

Other:  Male gender. Nasal bone visualized. Technicallly difficult due to
advanced GA and maternal habitus.
Cervix Uterus Adnexa

Cervix
Not visualized (advanced GA >54wks)

Uterus
No abnormality visualized.

Left Ovary
Not visualized.

Right Ovary
Not visualized.
Impression

Single IUP at 33w 0d
A2 GDM, Obesity, advanced maternal age
Somewhat limited views of the fetal anatomy were obtained
due to late gestational age
No gross anomalies were noted
The estimated fetal weight today is > 90th %tile.  The AC
measures > 97th %tile.
Polyhydramnios noted with an AFI of 34 cm.
Recommendations

Macrosomia / polyhydramnions likely due to marginally
controlled diabetes - may need medication adjustments or
insulin therapy.
Recommend 2x weekly NSTs with weekly AFIs
Delivery by 39 weeks gestation in the absence of other
complications.

## 2018-11-23 ENCOUNTER — Other Ambulatory Visit: Payer: Self-pay | Admitting: Internal Medicine

## 2018-11-25 NOTE — Progress Notes (Signed)
COVID Hotel Screening performed. Temperature.    Jazir Newey MSN, RN 

## 2018-11-29 LAB — NOVEL CORONAVIRUS, NAA: SARS-CoV-2, NAA: NOT DETECTED

## 2018-12-07 ENCOUNTER — Other Ambulatory Visit: Payer: Self-pay

## 2018-12-07 DIAGNOSIS — Z20822 Contact with and (suspected) exposure to covid-19: Secondary | ICD-10-CM

## 2018-12-10 LAB — NOVEL CORONAVIRUS, NAA: SARS-CoV-2, NAA: NOT DETECTED

## 2019-01-11 ENCOUNTER — Other Ambulatory Visit: Payer: Self-pay

## 2019-01-11 ENCOUNTER — Encounter (HOSPITAL_COMMUNITY): Payer: Self-pay | Admitting: Emergency Medicine

## 2019-01-11 ENCOUNTER — Ambulatory Visit (HOSPITAL_COMMUNITY)
Admission: EM | Admit: 2019-01-11 | Discharge: 2019-01-11 | Disposition: A | Discharge status: Home / Self Care | Payer: 59 | Attending: Emergency Medicine | Admitting: Emergency Medicine

## 2019-01-11 DIAGNOSIS — L03114 Cellulitis of left upper limb: Secondary | ICD-10-CM

## 2019-01-11 DIAGNOSIS — Z9884 Bariatric surgery status: Secondary | ICD-10-CM

## 2019-01-11 MED ORDER — CEFTRIAXONE SODIUM 1 G IJ SOLR
INTRAMUSCULAR | Status: AC
  Filled 2019-01-11: qty 10

## 2019-01-11 MED ORDER — CEFTRIAXONE SODIUM 1 G IJ SOLR
1.0000 g | Freq: Once | INTRAMUSCULAR | Status: AC
  Administered 2019-01-11: 1 g via INTRAMUSCULAR

## 2019-01-11 MED ORDER — SULFAMETHOXAZOLE-TRIMETHOPRIM 800-160 MG PO TABS: 1.0000 | ORAL_TABLET | Freq: Two times a day (BID) | ORAL | 0 refills | Status: AC

## 2019-01-11 NOTE — Discharge Instructions (Signed)
Take the prescribed antibiotic twice a day for 7 days.    Return here or go to the emergency department if the redness spreads or you develop worsening pain, red streaks on your arm, pus-like drainage from the wound, fever, chills, or other signs of infection.    Follow-up as scheduled with your dermatologist on Monday.

## 2019-01-11 NOTE — ED Triage Notes (Signed)
Pt here for wound check after having mole removed from left arm

## 2019-01-11 NOTE — ED Provider Notes (Signed)
[facility]    CSN: [id number] Arrival date & time: 01/11/19  1203      History   Chief Complaint Chief Complaint  Patient presents with  . Wound Check    HPI Katie [name] is a 43 y.o. female.   Patient presents with redness surrounding a wound on her left arm.  She states she had a mole removed by her dermatologist on 01/07/2019.  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  Patient states she is able to swallow pills and takes vitamins several times a day.  LMP: 1 week.   The history is provided by the patient.    Past Medical History:  Diagnosis Date  . AMA (advanced maternal age) multigravida 35+   . Anemia    WITH PREGNANCY  . Anxiety   . Arthritis    OSTEOARTHRITIS  . Asthma    as a child  . Carpal tunnel syndrome    only first pregnancy  . Gestational diabetes   . Morbid obesity (HCC)   . PONV (postoperative nausea and vomiting)   . Sleep apnea    USES C-PAP    Patient Active Problem List   Diagnosis Date Noted  . IDA (iron deficiency anemia) 04/10/2016  . Contact dermatitis - OR prep & drape 04/09/2016  . Postpartum care following cesarean delivery (11/24)  04/08/2016  . Morbid obesity with BMI of 50.0-59.9, adult (HCC) 05/24/2012  . Gestational diabetes mellitus, class A2 04/15/2011    Past Surgical History:  Procedure Laterality Date  . BARIATRIC SURGERY  2018  . CESAREAN SECTION WITH BILATERAL TUBAL LIGATION Bilateral 04/08/2016   Procedure: CESAREAN SECTION WITH BILATERAL TUBAL LIGATION;  Surgeon: Maxie Better, MD;  Location: [facility];  Service: Obstetrics;  Laterality: Bilateral;  . CHOLECYSTECTOMY     2002  . DILATATION & CURETTAGE/HYSTEROSCOPY WITH MYOSURE N/A 01/02/2018   Procedure: DILATATION & CURETTAGE/HYSTEROSCOPY;  Surgeon: Maxie Better, MD;  Location: [facility];  Service:  Gynecology;  Laterality: N/A;  . HYSTEROSCOPY WITH NOVASURE N/A 01/02/2018   Procedure: HYSTEROSCOPY WITH NOVASURE;  Surgeon: Maxie Better, MD;  Location: [facility];  Service: Gynecology;  Laterality: N/A;  . KNEE SURGERY Right 03/19/2014   orthoscopic arthroscopy  . TONSILLECTOMY AND ADENOIDECTOMY      OB History    Gravida  3   Para  3   Term  3   Preterm      AB      Living  3     SAB      TAB      Ectopic      Multiple  0   Live Births  3            Home Medications    Prior to Admission medications   Medication Sig Start Date End Date Taking? Authorizing Provider  acetaminophen (TYLENOL) 325 MG tablet Take 1 tablet (325 mg total) by mouth every 4 (four) hours as needed for mild pain. 04/11/16   Neta Mends, CNM  Calcium 200 MG TABS Take 400 mg by mouth 3 (three) times daily.    [provider]  Cholecalciferol (VITAMIN D-3 PO) Take 125 mcg by mouth daily.    [provider]  ferrous sulfate 325 (65 FE) MG tablet Take 325 mg by mouth daily with breakfast.    [provider]  Multiple Vitamin (MULTIVITAMIN WITH MINERALS) TABS tablet Take 1 tablet by mouth daily.    [provider]  omeprazole (PRILOSEC) 20 MG capsule Take 20 mg by mouth daily. 10/11/17   [provider]  sertraline (ZOLOFT) 100 MG tablet Take 100 mg by mouth every evening.     [provider]  sulfamethoxazole-trimethoprim (BACTRIM DS) 800-160 MG tablet Take 1 tablet by mouth 2 (two) times daily for 7 days. 01/11/19 01/18/19  Mickie Bailate, Yaa Donnellan H, NP    Family History Family History  Problem Relation Age of Onset  . Hypertension Mother   . Thyroid disease Mother   . Migraines Mother   . Diabetes Mother   . Multiple sclerosis Mother   . Diabetes Maternal Grandfather   . Anesthesia problems Neg Hx   . Hypotension Neg Hx   . Malignant hyperthermia Neg Hx   . Pseudochol deficiency Neg Hx     Social History Social History   Tobacco  Use  . Smoking status: Never Smoker  . Smokeless tobacco: Never Used  Substance Use Topics  . Alcohol use: Not Currently    Alcohol/week: 0.0 standard drinks    Comment: occ  . Drug use: No     Allergies   Anesthesia s-i-60 and Penicillins   Review of Systems Review of Systems  Constitutional: Negative for chills and fever.  HENT: Negative for ear pain and sore throat.   Eyes: Negative for pain and visual disturbance.  Respiratory: Negative for cough and shortness of breath.   Cardiovascular: Negative for chest pain and palpitations.  Gastrointestinal: Negative for abdominal pain and vomiting.  Genitourinary: Negative for dysuria and hematuria.  Musculoskeletal: Negative for arthralgias and back pain.  Skin: Positive for color change and wound. Negative for rash.  Neurological: Negative for seizures and syncope.  All other systems reviewed and are negative.    Physical Exam Triage Vital Signs ED Triage Vitals [01/11/19 1234]  Enc Vitals Group     BP 131/90     Pulse Rate 79     Resp 18     Temp 98.5 F (36.9 C)     Temp Source Oral     SpO2 98 %     Weight      Height      Head Circumference      Peak Flow      Pain Score 7     Pain Loc      Pain Edu?      Excl. in GC?    No data found.  Updated Vital Signs BP 131/90 (BP Location: Right Arm)   Pulse 79   Temp 98.5 F (36.9 C) (Oral)   Resp 18   SpO2 98%   Visual Acuity Right Eye Distance:   Left Eye Distance:   Bilateral Distance:    Right Eye Near:   Left Eye Near:    Bilateral Near:     Physical Exam Vitals signs and nursing note reviewed.  Constitutional:      General: She is not in acute distress.    Appearance: She is well-developed.  HENT:     Head: Normocephalic and atraumatic.  Eyes:     Conjunctiva/sclera: Conjunctivae normal.  Neck:     Musculoskeletal: Neck supple.  Cardiovascular:     Rate and Rhythm: Normal rate and regular rhythm.     Heart sounds: No murmur.   Pulmonary:     Effort: Pulmonary effort is normal. No respiratory distress.  Breath sounds: Normal breath sounds.  Abdominal:     Palpations: Abdomen is soft.     Tenderness: There is no abdominal tenderness.  Skin:    General: Skin is warm and dry.     Findings: Erythema and lesion present.     Comments: See picture for details.   Neurological:     Mental Status: She is alert.     Sensory: No sensory deficit.     Motor: No weakness.        UC Treatments / Results  Labs (all labs ordered are listed, but only abnormal results are displayed) Labs Reviewed - No data to display  EKG   Radiology No results found.  Procedures Procedures (including critical care time)  Medications Ordered in UC Medications  cefTRIAXone (ROCEPHIN) injection 1 g (1 g Intramuscular Given 01/11/19 1303)  cefTRIAXone (ROCEPHIN) 1 g injection (has no administration in time range)    Initial Impression / Assessment and Plan / UC Course  I have reviewed the triage vital signs and the nursing notes.  Pertinent labs & imaging results that were available during my care of the patient were reviewed by me and considered in my medical decision making (see chart for details).   LUE cellulitis.  Rocephin 1 g IM administered here.  Patient states she is able to take pills without difficulty.  Treating as Septra DS twice daily x7 days.  Patient is scheduled for follow-up with her dermatologist on 01/14/2019.  Discussed with patient that she should return here or go to the emergency department if she has increased redness, red streaks, purulent drainage, increased pain, fever, chills, or other signs of infection.  Patient agrees with treatment plan.     Final Clinical Impressions(s) / UC Diagnoses   Final diagnoses:  Cellulitis of left upper extremity     Discharge Instructions     Take the prescribed antibiotic twice a day for 7 days.    Return here or go to the emergency department if the redness  spreads or you develop worsening pain, red streaks on your arm, pus-like drainage from the wound, fever, chills, or other signs of infection.    Follow-up as scheduled with your dermatologist on Monday.        ED Prescriptions    Medication Sig Dispense Auth. Provider   sulfamethoxazole-trimethoprim (BACTRIM DS) 800-160 MG tablet Take 1 tablet by mouth 2 (two) times daily for 7 days. 14 tablet Sharion Balloon, NP     Controlled Substance Prescriptions [place] Controlled Substance Registry consulted? Not Applicable   Sharion Balloon, NP 01/11/19 1345

## 2019-02-08 ENCOUNTER — Other Ambulatory Visit: Payer: Self-pay

## 2019-02-08 DIAGNOSIS — Z20822 Contact with and (suspected) exposure to covid-19: Secondary | ICD-10-CM

## 2019-02-09 LAB — NOVEL CORONAVIRUS, NAA: SARS-CoV-2, NAA: NOT DETECTED

## 2019-04-05 ENCOUNTER — Other Ambulatory Visit: Payer: Self-pay

## 2019-04-05 DIAGNOSIS — Z20822 Contact with and (suspected) exposure to covid-19: Secondary | ICD-10-CM

## 2019-04-08 LAB — NOVEL CORONAVIRUS, NAA: SARS-CoV-2, NAA: NOT DETECTED

## 2019-05-06 ENCOUNTER — Other Ambulatory Visit: Payer: Self-pay

## 2019-05-06 DIAGNOSIS — Z20822 Contact with and (suspected) exposure to covid-19: Secondary | ICD-10-CM

## 2019-05-07 LAB — NOVEL CORONAVIRUS, NAA: SARS-CoV-2, NAA: NOT DETECTED

## 2019-05-31 ENCOUNTER — Other Ambulatory Visit: Payer: Self-pay | Admitting: Critical Care Medicine

## 2019-05-31 DIAGNOSIS — Z20822 Contact with and (suspected) exposure to covid-19: Secondary | ICD-10-CM

## 2019-06-01 LAB — NOVEL CORONAVIRUS, NAA: SARS-CoV-2, NAA: NOT DETECTED

## 2019-06-17 ENCOUNTER — Ambulatory Visit: Payer: 59 | Attending: Internal Medicine

## 2019-06-17 DIAGNOSIS — Z20822 Contact with and (suspected) exposure to covid-19: Secondary | ICD-10-CM

## 2019-06-18 LAB — NOVEL CORONAVIRUS, NAA: SARS-CoV-2, NAA: NOT DETECTED

## 2019-06-21 ENCOUNTER — Other Ambulatory Visit: Payer: Self-pay

## 2019-06-21 DIAGNOSIS — Z20822 Contact with and (suspected) exposure to covid-19: Secondary | ICD-10-CM

## 2019-06-23 LAB — NOVEL CORONAVIRUS, NAA: SARS-CoV-2, NAA: NOT DETECTED

## 2019-07-26 ENCOUNTER — Other Ambulatory Visit: Payer: Self-pay | Admitting: *Deleted

## 2019-07-26 DIAGNOSIS — Z20822 Contact with and (suspected) exposure to covid-19: Secondary | ICD-10-CM

## 2019-07-27 LAB — NOVEL CORONAVIRUS, NAA: SARS-CoV-2, NAA: NOT DETECTED

## 2020-01-10 ENCOUNTER — Other Ambulatory Visit: Payer: 59

## 2020-03-11 ENCOUNTER — Other Ambulatory Visit: Payer: Self-pay

## 2020-03-11 DIAGNOSIS — Z20822 Contact with and (suspected) exposure to covid-19: Secondary | ICD-10-CM

## 2020-03-13 LAB — NOVEL CORONAVIRUS, NAA: SARS-CoV-2, NAA: NOT DETECTED

## 2020-03-13 LAB — SARS-COV-2, NAA 2 DAY TAT

## 2020-12-29 ENCOUNTER — Telehealth: Payer: Self-pay | Admitting: Hematology and Oncology

## 2020-12-29 NOTE — Telephone Encounter (Signed)
A new hem appt has been scheduled for Katie [name] to see Dr. Leonides Schanz on 8/25 at 140pm. Pt aware to arrive 20 minutes early.

## 2021-01-07 ENCOUNTER — Encounter: Payer: 59 | Admitting: Hematology and Oncology

## 2021-01-07 ENCOUNTER — Other Ambulatory Visit: Payer: 59

## 2021-01-15 ENCOUNTER — Encounter: Payer: 59 | Admitting: Physician Assistant

## 2021-01-15 ENCOUNTER — Other Ambulatory Visit: Payer: 59

## 2021-01-15 ENCOUNTER — Telehealth: Payer: Self-pay | Admitting: Physician Assistant

## 2021-01-15 NOTE — Telephone Encounter (Signed)
Pt called in to r/s appt due to an emergency. I spoke to pt and r/s her to the next available slot. Pt is aware of appt date and time.

## 2021-01-25 NOTE — Progress Notes (Deleted)
[facility] [phone]   [phone]  INITIAL CONSULT NOTE  Patient Care Team: Tonye Pearson, MD as PCP - General (Family Medicine)  Hematological/Oncological History 1) 08/10/2020: WBC 5.2, Hgb 11.0 (L), MCV 80.6, Plt 243, Vitamin B12 652, transferrin 349, iron 42, ferritin 3 (L), TIBC 489 (H), UIBC 388(H(, iron saturation 9% (L)  2) 12/07/2020: Transferrin 338, iron 46, ferritin 4 (L), TIBC 473 (H), UIBC 396 (H), iron saturation 10% (L)  3) 01/26/2021: Establish care with Georga Kaufmann PA-C  CHIEF COMPLAINTS/PURPOSE OF CONSULTATION:  "*** "  HISTORY OF PRESENTING ILLNESS:  Katie [name] 45 y.o. female with medical history significant for ***  On review of the previous records ***  On exam today ***  MEDICAL HISTORY:  Past Medical History:  Diagnosis Date   AMA (advanced maternal age) multigravida 35+    Anemia    WITH PREGNANCY   Anxiety    Arthritis    OSTEOARTHRITIS   Asthma    as a child   Carpal tunnel syndrome    only first pregnancy   Gestational diabetes    Morbid obesity (HCC)    PONV (postoperative nausea and vomiting)    Sleep apnea    USES C-PAP    SURGICAL HISTORY: Past Surgical History:  Procedure Laterality Date   BARIATRIC SURGERY  2018   CESAREAN SECTION WITH BILATERAL TUBAL LIGATION Bilateral 04/08/2016   Procedure: CESAREAN SECTION WITH BILATERAL TUBAL LIGATION;  Surgeon: Maxie Better, MD;  [facility];  Service: Obstetrics;  Laterality: Bilateral;   CHOLECYSTECTOMY     2002   DILATATION & CURETTAGE/HYSTEROSCOPY WITH MYOSURE N/A 01/02/2018   Procedure: DILATATION & CURETTAGE/HYSTEROSCOPY;  Surgeon: Maxie Better, MD;  Location: [facility];  Service: Gynecology;  Laterality: N/A;   HYSTEROSCOPY WITH NOVASURE N/A 01/02/2018   Procedure: HYSTEROSCOPY WITH NOVASURE;  Surgeon: Maxie Better, MD;  Location: [facility];  Service: Gynecology;  Laterality: N/A;   KNEE SURGERY  Right 03/19/2014   orthoscopic arthroscopy   TONSILLECTOMY AND ADENOIDECTOMY      SOCIAL HISTORY: Social History   Socioeconomic History   Marital status: Married    Spouse name: Not on file   Number of children: Not on file   Years of education: Not on file   Highest education level: Not on file  Occupational History   Not on file  Tobacco Use   Smoking status: Never   Smokeless tobacco: Never  Vaping Use   Vaping Use: Never used  Substance and Sexual Activity   Alcohol use: Not Currently    Alcohol/week: 0.0 standard drinks    Comment: occ   Drug use: No   Sexual activity: Yes    Birth control/protection: None  Other Topics Concern   Not on file  Social History Narrative   Not on file   Social Determinants of Health   Financial Resource Strain: Not on file  Food Insecurity: Not on file  Transportation Needs: Not on file  Physical Activity: Not on file  Stress: Not on file  Social Connections: Not on file  Intimate Partner Violence: Not on file    FAMILY HISTORY: Family History  Problem Relation Age of Onset   Hypertension Mother    Thyroid disease Mother    Migraines Mother    Diabetes Mother    Multiple sclerosis Mother    Diabetes Maternal Grandfather    Anesthesia problems Neg Hx    Hypotension Neg Hx    Malignant hyperthermia  Neg Hx    Pseudochol deficiency Neg Hx     ALLERGIES:  is allergic to penicillins and propofol.  MEDICATIONS:  Current Outpatient Medications  Medication Sig Dispense Refill   acetaminophen (TYLENOL) 325 MG tablet Take 1 tablet (325 mg total) by mouth every 4 (four) hours as needed for mild pain.     Calcium 200 MG TABS Take 400 mg by mouth 3 (three) times daily.     Cholecalciferol (VITAMIN D-3 PO) Take 125 mcg by mouth daily.     ferrous sulfate 325 (65 FE) MG tablet Take 325 mg by mouth daily with breakfast.     Multiple Vitamin (MULTIVITAMIN WITH MINERALS) TABS tablet Take 1 tablet by mouth daily.     omeprazole  (PRILOSEC) 20 MG capsule Take 20 mg by mouth daily.  1   sertraline (ZOLOFT) 100 MG tablet Take 100 mg by mouth every evening.      No current facility-administered medications for this visit.    REVIEW OF SYSTEMS:   Constitutional: ( - ) fevers, ( - )  chills , ( - ) night sweats Eyes: ( - ) blurriness of vision, ( - ) double vision, ( - ) watery eyes Ears, nose, mouth, throat, and face: ( - ) mucositis, ( - ) sore throat Respiratory: ( - ) cough, ( - ) dyspnea, ( - ) wheezes Cardiovascular: ( - ) palpitation, ( - ) chest discomfort, ( - ) lower extremity swelling Gastrointestinal:  ( - ) nausea, ( - ) heartburn, ( - ) change in bowel habits Skin: ( - ) abnormal skin rashes Lymphatics: ( - ) new lymphadenopathy, ( - ) easy bruising Neurological: ( - ) numbness, ( - ) tingling, ( - ) new weaknesses Behavioral/Psych: ( - ) mood change, ( - ) new changes  All other systems were reviewed with the patient and are negative.  PHYSICAL EXAMINATION: ECOG PERFORMANCE STATUS: {CHL ONC ECOG PS:[id number]}  There were no vitals filed for this visit. There were no vitals filed for this visit.  GENERAL: well appearing *** in NAD  SKIN: skin color, texture, turgor are normal, no rashes or significant lesions EYES: conjunctiva are pink and non-injected, sclera clear OROPHARYNX: no exudate, no erythema; lips, buccal mucosa, and tongue normal  NECK: supple, non-tender LYMPH:  no palpable lymphadenopathy in the cervical, axillary or supraclavicular lymph nodes.  LUNGS: clear to auscultation and percussion with normal breathing effort HEART: regular rate & rhythm and no murmurs and no lower extremity edema ABDOMEN: soft, non-tender, non-distended, normal bowel sounds Musculoskeletal: no cyanosis of digits and no clubbing  PSYCH: alert & oriented x 3, fluent speech NEURO: no focal motor/sensory deficits  LABORATORY DATA:  I have reviewed the data as listed CBC Latest Ref Rng & Units 01/02/2018  04/09/2016 04/06/2016  WBC 4.0 - 10.5 K/uL 6.8 9.7 9.3  Hemoglobin 12.0 - 15.0 g/dL 03.5 8.0(L) 10.9(L)  Hematocrit 36.0 - 46.0 % 38.5 23.8(L) 33.0(L)  Platelets 150 - 400 K/uL 273 197 251    CMP Latest Ref Rng & Units 04/06/2016 09/14/2011 04/16/2010  Glucose 65 - 99 mg/dL 597(C) 163(A) 453(M)  BUN 6 - 20 mg/dL 16 18 11   Creatinine 0.44 - 1.00 mg/dL 4.68 0.5  Sodium 0.32 - 145 mmol/L 136 140 140  Potassium 3.5 - 5.1 mmol/L 4.7 4.2 4.7  Chloride 101 - 111 mmol/L 106 104 102  CO2 22 - 32 mmol/L 23 28 25   Calcium 8.9 - 10.3 mg/dL 9.1 9.1  8.7  Total Protein 6.0 - 8.3 g/dL - 7.0 -  Total Bilirubin 0.3 - 1.2 mg/dL - 1.1 -  Alkaline Phos 39 - 117 U/L - 80 -  AST 0 - 37 U/L - 15 -  ALT 0 - 35 U/L - 15 -     PATHOLOGY: ***  BLOOD FILM: *** Review of the peripheral blood smear showed normal appearing white cells with neutrophils that were appropriately lobated and granulated. There was no predominance of bi-lobed or hyper-segmented neutrophils appreciated. No Dohle bodies were noted. There was no left shifting, immature forms or blasts noted. Lymphocytes remain normal in size without any predominance of large granular lymphocytes. Red cells show no anisopoikilocytosis, macrocytes , microcytes or polychromasia. There were no schistocytes, target cells, echinocytes, acanthocytes, dacrocytes, or stomatocytes.There was no rouleaux formation, nucleated red cells, or intra-cellular inclusions noted. The platelets are normal in size, shape, and color without any clumping evident.  RADIOGRAPHIC STUDIES: I have personally reviewed the radiological images as listed and agreed with the findings in the report. No results found.  ASSESSMENT & PLAN ***  No orders of the defined types were placed in this encounter.   All questions were answered. The patient knows to call the clinic with any problems, questions or concerns.  I have spent a total of {CHL ONC TIME VISIT - HERDE:[id number]} minutes of  face-to-face and non-face-to-face time, preparing to see the patient, obtaining and/or reviewing separately obtained history, performing a medically appropriate examination, counseling and educating the patient, ordering medications/tests/procedures, referring and communicating with other health care professionals, documenting clinical information in the electronic health record, independently interpreting results and communicating results to the patient, and care coordination.   Georga Kaufmann, PA-C Department of Hematology/Oncology [facility] at [facility] Phone: [phone]

## 2021-01-26 ENCOUNTER — Other Ambulatory Visit: Payer: Self-pay

## 2021-01-26 ENCOUNTER — Encounter: Payer: Self-pay | Admitting: Physician Assistant

## 2021-01-26 ENCOUNTER — Telehealth: Payer: Self-pay | Admitting: Physician Assistant

## 2021-01-26 NOTE — Telephone Encounter (Signed)
Moved pt's appt due to provider being unexpectedly out of the office. Pt is aware of new appt date and time.

## 2021-02-05 ENCOUNTER — Inpatient Hospital Stay: Payer: BC Managed Care – PPO | Attending: Hematology and Oncology | Admitting: Physician Assistant

## 2021-02-05 ENCOUNTER — Other Ambulatory Visit: Payer: Self-pay

## 2021-02-05 ENCOUNTER — Inpatient Hospital Stay: Payer: BC Managed Care – PPO

## 2021-02-05 ENCOUNTER — Encounter: Payer: Self-pay | Admitting: Physician Assistant

## 2021-02-05 VITALS — BP 124/77 | HR 76 | Temp 97.8°F | Resp 17 | Ht 63.0 in | Wt 194.3 lb

## 2021-02-05 DIAGNOSIS — D509 Iron deficiency anemia, unspecified: Secondary | ICD-10-CM | POA: Diagnosis present

## 2021-02-05 DIAGNOSIS — Z79899 Other long term (current) drug therapy: Secondary | ICD-10-CM | POA: Diagnosis not present

## 2021-02-05 DIAGNOSIS — Z9884 Bariatric surgery status: Secondary | ICD-10-CM | POA: Insufficient documentation

## 2021-02-05 DIAGNOSIS — E611 Iron deficiency: Secondary | ICD-10-CM

## 2021-02-05 DIAGNOSIS — Z8719 Personal history of other diseases of the digestive system: Secondary | ICD-10-CM | POA: Insufficient documentation

## 2021-02-05 DIAGNOSIS — D508 Other iron deficiency anemias: Secondary | ICD-10-CM

## 2021-02-05 LAB — CMP (CANCER CENTER ONLY)
ALT: 63 U/L — ABNORMAL HIGH (ref 0–44)
AST: 35 U/L (ref 15–41)
Albumin: 4 g/dL (ref 3.5–5.0)
Alkaline Phosphatase: 80 U/L (ref 38–126)
Anion gap: 8 (ref 5–15)
BUN: 16 mg/dL (ref 6–20)
CO2: 22 mmol/L (ref 22–32)
Calcium: 8.2 mg/dL — ABNORMAL LOW (ref 8.9–10.3)
Chloride: 111 mmol/L (ref 98–111)
Creatinine: 0.65 mg/dL (ref 0.44–1.00)
GFR, Estimated: >60 mL/min (ref >60)
Glucose, Bld: 98 mg/dL (ref 70–99)
Potassium: 3.9 mmol/L (ref 3.5–5.1)
Sodium: 141 mmol/L (ref 135–145)
Total Bilirubin: 0.6 mg/dL (ref 0.3–1.2)
Total Protein: 6.4 g/dL — ABNORMAL LOW (ref 6.5–8.1)

## 2021-02-05 LAB — CBC WITH DIFFERENTIAL (CANCER CENTER ONLY)
Abs Immature Granulocytes: 0.01 10*3/uL (ref 0.00–0.07)
Basophils Absolute: 0 10*3/uL (ref 0.0–0.1)
Basophils Relative: 1 %
Eosinophils Absolute: 0.2 10*3/uL (ref 0.0–0.5)
Eosinophils Relative: 3 %
HCT: 35.6 % — ABNORMAL LOW (ref 36.0–46.0)
Hemoglobin: 11.5 g/dL — ABNORMAL LOW (ref 12.0–15.0)
Immature Granulocytes: 0 %
Lymphocytes Relative: 28 %
Lymphs Abs: 1.6 10*3/uL (ref 0.7–4.0)
MCH: 28.4 pg (ref 26.0–34.0)
MCHC: 32.3 g/dL (ref 30.0–36.0)
MCV: 87.9 fL (ref 80.0–100.0)
Monocytes Absolute: 0.5 10*3/uL (ref 0.1–1.0)
Monocytes Relative: 8 %
Neutro Abs: 3.4 10*3/uL (ref 1.7–7.7)
Neutrophils Relative %: 60 %
Platelet Count: 277 10*3/uL (ref 150–400)
RBC: 4.05 MIL/uL (ref 3.87–5.11)
RDW: 12.8 % (ref 11.5–15.5)
WBC Count: 5.7 10*3/uL (ref 4.0–10.5)
nRBC: 0 % (ref 0.0–0.2)

## 2021-02-05 LAB — IRON AND TIBC
Iron: 29 ug/dL — ABNORMAL LOW (ref 41–142)
Saturation Ratios: 6 % — ABNORMAL LOW (ref 21–57)
TIBC: 451 ug/dL — ABNORMAL HIGH (ref 236–444)
UIBC: 422 ug/dL — ABNORMAL HIGH (ref 120–384)

## 2021-02-05 LAB — RETIC PANEL
Immature Retic Fract: 13.6 % (ref 2.3–15.9)
RBC.: 4.08 MIL/uL (ref 3.87–5.11)
Retic Count, Absolute: 66.9 10*3/uL (ref 19.0–186.0)
Retic Ct Pct: 1.6 % (ref 0.4–3.1)
Reticulocyte Hemoglobin: 29.1 pg (ref >27.9)

## 2021-02-05 LAB — VITAMIN B12: Vitamin B-12: 2151 pg/mL — ABNORMAL HIGH (ref 180–914)

## 2021-02-05 LAB — FOLATE: Folate: 41 ng/mL (ref >5.9)

## 2021-02-05 LAB — FERRITIN: Ferritin: 4 ng/mL — ABNORMAL LOW (ref 11–307)

## 2021-02-05 NOTE — Progress Notes (Signed)
[facility] [phone]   Fax:([phone]) [phone]  INITIAL CONSULT NOTE  Patient Care Team: Tonye Pearson, MD as PCP - General (Family Medicine)  Hematological/Oncological History 1) Prior labs:  -08/10/2020: WBC 5.2, Hgb 11.0 (L), MCV 80.6, Plt 242, Vitamin B12 level 652, Iron 42, Ferritin 3 (L), TIBC 489 (H), Iron saturation 9% -12/07/2020: Iron 46, Ferritin 4 (L), TIBC 473 (H), iron saturation 10% (L)  2) 02/05/2021: Establish care with [facility] Hematology/Oncology  CHIEF COMPLAINTS/PURPOSE OF CONSULTATION:  "Iron-deficiency anemia"  HISTORY OF PRESENTING ILLNESS:  Katie [name] 45 y.o. female with medical history significant for anemia, osteoarthritis, s/p myomectomy and endometrial ablation in 2019 for uterine fibroids and history of a duodenal switch surgery in 2018 who presents today for evaluation of iron-deficiency anemia. She sees weight management following her duodenal switch surgery, and reports that they have been monitoring her labs and referred her for potential IV iron. The patient reports that she has a history of anemia for "many years" that she believes began during pregnancy. Review of records shows evidence of anemia on and off since at least 2012. She denies any history of iron infusions. She denies any family history of bleeding or clotting disorders, nutritional deficiencies, or anemias.  She reports that following her last pregnancy, she developed menorrhagia and was found to have fibroids. Since the endometrial ablation, the menorrhagia has resolved and she now has menstrual cycles every 5-6 weeks with 1-2 days of light flow.   For iron-deficiency anemia, she reports taking PO iron daily at night. She either takes Publishing rights manager brand of iron supplements, and reports taking two 27mg  tablets at night. She reports taking ferrous sulfate for iron-deficiency prior to her duodenal switch surgery, but since the surgery her nausea has  worsened with ferrous sulfate and caused her to switch to a different supplement. She reports taking multivitamin following the switch surgery. She reports that she incorporates iron-rich foods into her diet.   Today, she reports fatigue for the past 6 mo that has worsened and has begun to interfere with work. She reports easy bruising. She denies any other bleeding, including gingival bleeding, hemoptysis, blood in stools or melena. She reports occasional lightheadedness when going from sitting to standing. Other than nausea with PO iron, she denies nausea, vomiting, diarrhea or constipation. She reports a good appetite and oral fluid intake. She denies fevers, sweats or chills. She has no other concerns today.  MEDICAL HISTORY:  Past Medical History:  Diagnosis Date   AMA (advanced maternal age) multigravida 35+    Anemia    WITH PREGNANCY   Anxiety    Arthritis    OSTEOARTHRITIS   Asthma    as a child   Carpal tunnel syndrome    only first pregnancy   Gestational diabetes    Morbid obesity (HCC)    PONV (postoperative nausea and vomiting)    Sleep apnea    USES C-PAP    SURGICAL HISTORY: Past Surgical History:  Procedure Laterality Date   BARIATRIC SURGERY  2018   CESAREAN SECTION WITH BILATERAL TUBAL LIGATION Bilateral 04/08/2016   Procedure: CESAREAN SECTION WITH BILATERAL TUBAL LIGATION;  Surgeon: 04/10/2016, MD;  [facility];  Service: Obstetrics;  Laterality: Bilateral;   CHOLECYSTECTOMY     2002   DILATATION & CURETTAGE/HYSTEROSCOPY WITH MYOSURE N/A 01/02/2018   Procedure: DILATATION & CURETTAGE/HYSTEROSCOPY;  Surgeon: 01/04/2018, MD;  Location: [facility];  Service: Gynecology;  Laterality: N/A;   HYSTEROSCOPY  WITH NOVASURE N/A 01/02/2018   Procedure: HYSTEROSCOPY WITH NOVASURE;  Surgeon: Maxie Better, MD;  Location: [facility];  Service: Gynecology;  Laterality: N/A;   KNEE SURGERY Right 03/19/2014   orthoscopic arthroscopy    TONSILLECTOMY AND ADENOIDECTOMY      SOCIAL HISTORY: Social History   Socioeconomic History   Marital status: Media planner    Spouse name: Not on file   Number of children: Not on file   Years of education: Not on file   Highest education level: Not on file  Occupational History   Not on file  Tobacco Use   Smoking status: Never   Smokeless tobacco: Never  Vaping Use   Vaping Use: Never used  Substance and Sexual Activity   Alcohol use: Not Currently    Alcohol/week: 0.0 standard drinks    Comment: occ   Drug use: No   Sexual activity: Yes    Birth control/protection: None  Other Topics Concern   Not on file  Social History Narrative   Not on file   Social Determinants of Health   Financial Resource Strain: Not on file  Food Insecurity: Not on file  Transportation Needs: Not on file  Physical Activity: Not on file  Stress: Not on file  Social Connections: Not on file  Intimate Partner Violence: Not on file    FAMILY HISTORY: Family History  Problem Relation Age of Onset   Hypertension Mother    Thyroid disease Mother    Migraines Mother    Diabetes Mother    Multiple sclerosis Mother    Diabetes Maternal Grandfather    Anesthesia problems Neg Hx    Hypotension Neg Hx    Malignant hyperthermia Neg Hx    Pseudochol deficiency Neg Hx     ALLERGIES:  is allergic to penicillins and propofol.  MEDICATIONS:  Current Outpatient Medications  Medication Sig Dispense Refill   Calcium 200 MG TABS Take 400 mg by mouth 3 (three) times daily.     Cholecalciferol (VITAMIN D-3 PO) Take 125 mcg by mouth daily.     Multiple Vitamin (MULTIVITAMIN WITH MINERALS) TABS tablet Take 1 tablet by mouth daily.     omeprazole (PRILOSEC) 20 MG capsule Take 20 mg by mouth daily.  1   sertraline (ZOLOFT) 100 MG tablet Take 100 mg by mouth every evening.      ferrous sulfate 325 (65 FE) MG tablet Take 325 mg by mouth daily with breakfast. (Patient not taking: Reported on  02/05/2021)     No current facility-administered medications for this visit.    REVIEW OF SYSTEMS:   Constitutional: ( + ) fatigue, ( - ) fevers, ( - )  chills , ( - ) night sweats, ( + ) lightheadedness Eyes: ( - ) blurriness of vision, ( - ) double vision, ( - ) watery eyes Ears, nose, mouth, throat, and face: ( - ) mucositis, ( - ) sore throat Respiratory: ( - ) cough, ( - ) dyspnea, ( - ) wheezes Cardiovascular: ( - ) palpitation, ( - ) chest discomfort, ( - ) lower extremity swelling Gastrointestinal:  ( - ) nausea, ( - ) heartburn, ( - ) change in bowel habits Skin: ( - ) abnormal skin rashes,  ( + ) easy bruising Lymphatics: ( - ) new lymphadenopathy, ( - ) easy bruising Neurological: ( - ) numbness, ( - ) tingling, ( - ) new weaknesses Behavioral/Psych: ( - ) mood change, ( - ) new changes  All  other systems were reviewed with the patient and are negative.  PHYSICAL EXAMINATION: ECOG PERFORMANCE STATUS: 1 - Symptomatic but completely ambulatory  Vitals:   02/05/21 1314  BP: 124/77  Pulse: 76  Resp: 17  Temp: 97.8 F (36.6 C)  SpO2: 98%   Filed Weights   02/05/21 1314  Weight: 194 lb 4.8 oz (88.1 kg)    GENERAL: well appearing female in NAD  SKIN: skin color, texture, turgor are normal, no rashes or significant lesions EYES: conjunctiva are pale, sclera clear OROPHARYNX: no exudate, no erythema; lips, buccal mucosa, and tongue normal  NECK: supple, non-tender LYMPH:  no palpable lymphadenopathy in the cervical, axillary or supraclavicular lymph nodes.  LUNGS: clear to auscultation and percussion with normal breathing effort HEART: regular rate & rhythm and no murmurs and no lower extremity edema ABDOMEN: soft, non-tender, non-distended, normal bowel sounds Musculoskeletal: no cyanosis of digits and no clubbing  PSYCH: alert & oriented x 3, fluent speech NEURO: no focal motor/sensory deficits  LABORATORY DATA:  I have reviewed the data as listed CBC Latest Ref  Rng & Units 02/05/2021 01/02/2018 04/09/2016  WBC 4.0 - 10.5 K/uL 5.7 6.8 9.7  Hemoglobin 12.0 - 15.0 g/dL 11.5(L) 12.4 8.0(L)  Hematocrit 36.0 - 46.0 % 35.6(L) 38.5 23.8(L)  Platelets 150 - 400 K/uL 277 273 197    CMP Latest Ref Rng & Units 02/05/2021 04/06/2016 09/14/2011  Glucose 70 - 99 mg/dL 98 010(O) 712(R)  BUN 6 - 20 mg/dL 16 16 18   Creatinine 0.44 - 1.00 mg/dL 9.75 8.83  Sodium 135 - 145 mmol/L 141 136 140  Potassium 3.5 - 5.1 mmol/L 3.9 4.7 4.2  Chloride 98 - 111 mmol/L 111 106 104  CO2 22 - 32 mmol/L 22 23 28   Calcium 8.9 - 10.3 mg/dL 8.2(L) 9.1 9.1  Total Protein 6.5 - 8.1 g/dL 6.4(L) - 7.0  Total Bilirubin 0.3 - 1.2 mg/dL 0.6 - 1.1  Alkaline Phos 38 - 126 U/L 80 - 80  AST 15 - 41 U/L 35 - 15  ALT 0 - 44 U/L 63(H) - 15    ASSESSMENT & PLAN Katie [name] is a 45 y.o. female who presents to the clinic for iron deficiency anemia. She is currently on oral iron supplementation but reports issues with nausea. Patient underwent bariatric surgery in 2018 which is likely the underlying cause of her iron deficiency anemia.   Recommend to proceed with laboratory evaluation to check CBC, CMP, iron and TIBC, ferritin, retic panel, vitamin B12, MMA, folate and homocysteine levels.   If there is evidence of persistent iron deficiency, we recommend IV iron infusions.   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  --Currently on oral iron supplementation. Used to take ferrous sulfate but caused GI intolerance with nausea. Now she takes 45 brand of iron supplements --Recommend labs today to check CBC, CMP, iron and TIBC, retic panel and ferritin.  --Recommend IV venofer x 5 doses if there is evidence of persistent iron deficiency anemia due to ongoing fatigue.  --RTC 8 weeks with labs prior  Orders Placed This Encounter  Procedures   CBC with Differential (Cancer Center Only)    Standing Status:   Future    Number of  Occurrences:   1    Standing Expiration Date:   02/05/2022   Folate, Serum    Standing Status:   Future    Number of Occurrences:   1    Standing  Expiration Date:   02/05/2022   Methylmalonic acid, serum    Standing Status:   Future    Number of Occurrences:   1    Standing Expiration Date:   02/05/2022   Vitamin B12    Standing Status:   Future    Number of Occurrences:   1    Standing Expiration Date:   02/05/2022   Homocysteine, serum    Standing Status:   Future    Number of Occurrences:   1    Standing Expiration Date:   02/05/2022   Ferritin    Standing Status:   Future    Number of Occurrences:   1    Standing Expiration Date:   02/05/2022   Iron and TIBC    Standing Status:   Future    Number of Occurrences:   1    Standing Expiration Date:   02/05/2022   Retic Panel    Standing Status:   Future    Number of Occurrences:   1    Standing Expiration Date:   02/05/2022   CMP (Cancer Center only)    Standing Status:   Future    Number of Occurrences:   1    Standing Expiration Date:   02/05/2022    All questions were answered. The patient knows to call the clinic with any problems, questions or concerns.  I have spent a total of 60 minutes minutes of face-to-face and non-face-to-face time, preparing to see the patient, obtaining and/or reviewing separately obtained history, performing a medically appropriate examination, counseling and educating the patient, ordering medications/tests, documenting clinical information in the electronic health record, and care coordination.   Georga Kaufmann, PA-C Department of Hematology/Oncology [facility] at [facility] Phone: [phone]  Patient was seen with Dr. Leonides Schanz.   I have read the above note and personally examined the patient. I agree with the assessment and plan as noted above.  Briefly Ms. Kocian is a 45 year old female with medical history significant for duodenal switch surgery who presents for  evaluation of iron deficiency anemia.  The likely etiology of her iron deficiency anemia is poor absorption due to this weight loss surgery.  As such would recommend supplementation with IV iron therapy.  Additionally we will check her for other nutritional deficiencies such as vitamin B12.  Plan to have the patient return to clinic approximate 4 to 6 weeks after last dose of IV iron.   Ulysees Barns, MD Department of Hematology/Oncology [facility] at [facility] Phone: [phone] Pager: [phone] Email: [email or web address]

## 2021-02-06 LAB — HOMOCYSTEINE: Homocysteine: 6.7 umol/L (ref 0.0–14.5)

## 2021-02-08 ENCOUNTER — Telehealth: Payer: Self-pay | Admitting: Physician Assistant

## 2021-02-08 ENCOUNTER — Telehealth: Payer: Self-pay | Admitting: Pharmacy Technician

## 2021-02-08 ENCOUNTER — Other Ambulatory Visit: Payer: Self-pay | Admitting: Pharmacy Technician

## 2021-02-08 ENCOUNTER — Encounter: Payer: Self-pay | Admitting: Physician Assistant

## 2021-02-08 DIAGNOSIS — E611 Iron deficiency: Secondary | ICD-10-CM | POA: Insufficient documentation

## 2021-02-08 NOTE — Telephone Encounter (Signed)
Dr. Charlies Silvers,  Auth Submission: VENOFER - NO AUTH REQUIRED Payer: BCBS Medication & CPT/J Code(s) submitted: Venofer (Iron Sucrose) J1756 Route of submission (phone, fax, portal): PORTAL Auth type: Buy/Bill Units/visits requested: 5 Reference number: [id number]   Patient has $2000 deductilbe which has NOT been met. OOP = $6000 which has NOT been met. 20% co-insurance.

## 2021-02-08 NOTE — Telephone Encounter (Signed)
I called [name] and reviewed the lab results from 02/05/21. There is evidence of iron deficiency anemia. Recommendation is to proceed with IV venofer x 5 doses. We will follow up in clinic in 8 weeks with repeat labs.   MMA levels are pending. I will follow up if any further intervention is needed. Patient expressed understanding of the plan provided.

## 2021-02-09 ENCOUNTER — Encounter: Payer: Self-pay | Admitting: Physician Assistant

## 2021-02-11 ENCOUNTER — Other Ambulatory Visit: Payer: Self-pay

## 2021-02-11 ENCOUNTER — Ambulatory Visit (INDEPENDENT_AMBULATORY_CARE_PROVIDER_SITE_OTHER): Payer: BC Managed Care – PPO

## 2021-02-11 VITALS — BP 116/77 | HR 58 | Temp 97.9°F | Resp 18

## 2021-02-11 DIAGNOSIS — E611 Iron deficiency: Secondary | ICD-10-CM

## 2021-02-11 LAB — METHYLMALONIC ACID, SERUM: Methylmalonic Acid, Quantitative: 101 nmol/L (ref 0–378)

## 2021-02-11 MED ORDER — FAMOTIDINE IN NACL 20-0.9 MG/50ML-% IV SOLN: 20.0000 mg | Freq: Once | INTRAVENOUS | Status: DC | PRN

## 2021-02-11 MED ORDER — SODIUM CHLORIDE 0.9 % IV SOLN
200.0000 mg | Freq: Once | INTRAVENOUS | Status: AC
  Administered 2021-02-11: 200 mg via INTRAVENOUS
  Filled 2021-02-11: qty 10

## 2021-02-11 MED ORDER — EPINEPHRINE 0.3 MG/0.3ML IJ SOAJ: 0.3000 mg | Freq: Once | INTRAMUSCULAR | Status: DC | PRN

## 2021-02-11 MED ORDER — SODIUM CHLORIDE 0.9 % IV SOLN: Freq: Once | INTRAVENOUS | Status: DC | PRN

## 2021-02-11 MED ORDER — ACETAMINOPHEN 325 MG PO TABS
650.0000 mg | ORAL_TABLET | Freq: Once | ORAL | Status: AC
  Administered 2021-02-11: 650 mg via ORAL
  Filled 2021-02-11: qty 2

## 2021-02-11 MED ORDER — METHYLPREDNISOLONE SODIUM SUCC 125 MG IJ SOLR: 125.0000 mg | Freq: Once | INTRAMUSCULAR | Status: DC | PRN

## 2021-02-11 MED ORDER — DIPHENHYDRAMINE HCL 50 MG/ML IJ SOLN: 50.0000 mg | Freq: Once | INTRAMUSCULAR | Status: DC | PRN

## 2021-02-11 MED ORDER — ALBUTEROL SULFATE HFA 108 (90 BASE) MCG/ACT IN AERS: 2.0000 | INHALATION_SPRAY | Freq: Once | RESPIRATORY_TRACT | Status: DC | PRN

## 2021-02-11 MED ORDER — DIPHENHYDRAMINE HCL 25 MG PO CAPS
50.0000 mg | ORAL_CAPSULE | Freq: Once | ORAL | Status: AC
  Administered 2021-02-11: 25 mg via ORAL
  Filled 2021-02-11: qty 2

## 2021-02-11 NOTE — Progress Notes (Signed)
Diagnosis: Iron Deficiency Anemia  Provider:  Chilton Greathouse, MD  Procedure: Infusion  IV Type: Peripheral, IV Location: R Antecubital  Venofer (Iron Sucrose), Dose: 200 mg  Infusion Start Time: 1103  Infusion Stop Time: 1123  Post Infusion IV Care: Observation period completed and Peripheral IV Discontinued  Discharge: Condition: Good, Destination: Home . AVS provided to patient.   Performed by:  Marguarite Arbour, RN

## 2021-02-17 ENCOUNTER — Other Ambulatory Visit: Payer: Self-pay

## 2021-02-17 ENCOUNTER — Ambulatory Visit (INDEPENDENT_AMBULATORY_CARE_PROVIDER_SITE_OTHER): Payer: BC Managed Care – PPO

## 2021-02-17 VITALS — BP 111/69 | HR 70 | Temp 98.5°F | Ht 63.0 in | Wt 190.8 lb

## 2021-02-17 DIAGNOSIS — E611 Iron deficiency: Secondary | ICD-10-CM

## 2021-02-17 DIAGNOSIS — D509 Iron deficiency anemia, unspecified: Secondary | ICD-10-CM | POA: Diagnosis not present

## 2021-02-17 MED ORDER — ACETAMINOPHEN 325 MG PO TABS
650.0000 mg | ORAL_TABLET | Freq: Once | ORAL | Status: AC
  Administered 2021-02-17: 650 mg via ORAL
  Filled 2021-02-17: qty 2

## 2021-02-17 MED ORDER — DIPHENHYDRAMINE HCL 25 MG PO CAPS
50.0000 mg | ORAL_CAPSULE | Freq: Once | ORAL | Status: AC
  Administered 2021-02-17: 50 mg via ORAL
  Filled 2021-02-17: qty 2

## 2021-02-17 MED ORDER — FAMOTIDINE IN NACL 20-0.9 MG/50ML-% IV SOLN: 20.0000 mg | Freq: Once | INTRAVENOUS | Status: DC | PRN

## 2021-02-17 MED ORDER — DIPHENHYDRAMINE HCL 50 MG/ML IJ SOLN: 50.0000 mg | Freq: Once | INTRAMUSCULAR | Status: DC | PRN

## 2021-02-17 MED ORDER — EPINEPHRINE 0.3 MG/0.3ML IJ SOAJ: 0.3000 mg | Freq: Once | INTRAMUSCULAR | Status: DC | PRN

## 2021-02-17 MED ORDER — ALBUTEROL SULFATE HFA 108 (90 BASE) MCG/ACT IN AERS: 2.0000 | INHALATION_SPRAY | Freq: Once | RESPIRATORY_TRACT | Status: DC | PRN

## 2021-02-17 MED ORDER — SODIUM CHLORIDE 0.9 % IV SOLN
200.0000 mg | Freq: Once | INTRAVENOUS | Status: AC
  Administered 2021-02-17: 200 mg via INTRAVENOUS
  Filled 2021-02-17: qty 10

## 2021-02-17 MED ORDER — METHYLPREDNISOLONE SODIUM SUCC 125 MG IJ SOLR: 125.0000 mg | Freq: Once | INTRAMUSCULAR | Status: DC | PRN

## 2021-02-17 MED ORDER — SODIUM CHLORIDE 0.9 % IV SOLN: Freq: Once | INTRAVENOUS | Status: DC | PRN

## 2021-02-17 NOTE — Progress Notes (Signed)
Diagnosis: Iron Deficiency Anemia  Provider:  Chilton Greathouse, MD  Procedure: Infusion  IV Type: Peripheral, IV Location: R Antecubital  Venofer (Iron Sucrose), Dose: 200 mg  Infusion Start Time: 1129  Infusion Stop Time: 1150  Post Infusion IV Care: Peripheral IV Discontinued  Discharge: Condition: Good, Destination: Home . AVS provided to patient.   Performed by:  Demarrius Guerrero, Lyman Speller, LPN

## 2021-02-25 ENCOUNTER — Other Ambulatory Visit: Payer: Self-pay

## 2021-02-26 ENCOUNTER — Ambulatory Visit (INDEPENDENT_AMBULATORY_CARE_PROVIDER_SITE_OTHER): Payer: BC Managed Care – PPO

## 2021-02-26 ENCOUNTER — Other Ambulatory Visit: Payer: Self-pay

## 2021-02-26 VITALS — BP 108/66 | HR 66 | Temp 98.5°F | Resp 16 | Ht 63.0 in | Wt 194.4 lb

## 2021-02-26 DIAGNOSIS — D509 Iron deficiency anemia, unspecified: Secondary | ICD-10-CM

## 2021-02-26 DIAGNOSIS — E611 Iron deficiency: Secondary | ICD-10-CM

## 2021-02-26 MED ORDER — DIPHENHYDRAMINE HCL 50 MG/ML IJ SOLN: 50.0000 mg | Freq: Once | INTRAMUSCULAR | Status: DC | PRN

## 2021-02-26 MED ORDER — FAMOTIDINE IN NACL 20-0.9 MG/50ML-% IV SOLN: 20.0000 mg | Freq: Once | INTRAVENOUS | Status: DC | PRN

## 2021-02-26 MED ORDER — DIPHENHYDRAMINE HCL 25 MG PO CAPS
50.0000 mg | ORAL_CAPSULE | Freq: Once | ORAL | Status: AC
  Administered 2021-02-26: 50 mg via ORAL
  Filled 2021-02-26: qty 2

## 2021-02-26 MED ORDER — ALBUTEROL SULFATE HFA 108 (90 BASE) MCG/ACT IN AERS: 2.0000 | INHALATION_SPRAY | Freq: Once | RESPIRATORY_TRACT | Status: DC | PRN

## 2021-02-26 MED ORDER — SODIUM CHLORIDE 0.9 % IV SOLN: Freq: Once | INTRAVENOUS | Status: DC | PRN

## 2021-02-26 MED ORDER — SODIUM CHLORIDE 0.9 % IV SOLN
200.0000 mg | Freq: Once | INTRAVENOUS | Status: AC
  Administered 2021-02-26: 200 mg via INTRAVENOUS
  Filled 2021-02-26: qty 10

## 2021-02-26 MED ORDER — METHYLPREDNISOLONE SODIUM SUCC 125 MG IJ SOLR: 125.0000 mg | Freq: Once | INTRAMUSCULAR | Status: DC | PRN

## 2021-02-26 MED ORDER — EPINEPHRINE 0.3 MG/0.3ML IJ SOAJ: 0.3000 mg | Freq: Once | INTRAMUSCULAR | Status: DC | PRN

## 2021-02-26 MED ORDER — ACETAMINOPHEN 325 MG PO TABS
650.0000 mg | ORAL_TABLET | Freq: Once | ORAL | Status: AC
  Administered 2021-02-26: 650 mg via ORAL
  Filled 2021-02-26: qty 2

## 2021-02-26 NOTE — Progress Notes (Signed)
Diagnosis: Iron Deficiency Anemia  Provider:  Chilton Greathouse, MD  Procedure: Infusion  IV Type: Peripheral, IV Location: R Forearm  Venofer (Iron Sucrose), Dose: 200 mg  Infusion Start Time: 1403  Infusion Stop Time: 1418  Post Infusion IV Care: Peripheral IV Discontinued  Discharge: Condition: Good, Destination: Home . AVS provided to patient.   Performed by:  Daizha Anand, Lyman Speller, LPN

## 2021-03-05 ENCOUNTER — Ambulatory Visit (INDEPENDENT_AMBULATORY_CARE_PROVIDER_SITE_OTHER): Payer: BC Managed Care – PPO

## 2021-03-05 ENCOUNTER — Other Ambulatory Visit: Payer: Self-pay

## 2021-03-05 VITALS — BP 115/67 | HR 69 | Temp 98.6°F | Resp 16 | Ht 63.0 in | Wt 189.8 lb

## 2021-03-05 DIAGNOSIS — D509 Iron deficiency anemia, unspecified: Secondary | ICD-10-CM | POA: Diagnosis not present

## 2021-03-05 DIAGNOSIS — E611 Iron deficiency: Secondary | ICD-10-CM

## 2021-03-05 MED ORDER — DIPHENHYDRAMINE HCL 50 MG/ML IJ SOLN: 50.0000 mg | Freq: Once | INTRAMUSCULAR | Status: DC | PRN

## 2021-03-05 MED ORDER — DIPHENHYDRAMINE HCL 25 MG PO CAPS
50.0000 mg | ORAL_CAPSULE | Freq: Once | ORAL | Status: AC
  Administered 2021-03-05: 50 mg via ORAL

## 2021-03-05 MED ORDER — SODIUM CHLORIDE 0.9 % IV SOLN: Freq: Once | INTRAVENOUS | Status: DC | PRN

## 2021-03-05 MED ORDER — SODIUM CHLORIDE 0.9 % IV SOLN
200.0000 mg | Freq: Once | INTRAVENOUS | Status: AC
  Administered 2021-03-05: 200 mg via INTRAVENOUS
  Filled 2021-03-05: qty 10

## 2021-03-05 MED ORDER — FAMOTIDINE IN NACL 20-0.9 MG/50ML-% IV SOLN: 20.0000 mg | Freq: Once | INTRAVENOUS | Status: DC | PRN

## 2021-03-05 MED ORDER — ACETAMINOPHEN 325 MG PO TABS
650.0000 mg | ORAL_TABLET | Freq: Once | ORAL | Status: AC
  Administered 2021-03-05: 650 mg via ORAL

## 2021-03-05 MED ORDER — ALBUTEROL SULFATE HFA 108 (90 BASE) MCG/ACT IN AERS: 2.0000 | INHALATION_SPRAY | Freq: Once | RESPIRATORY_TRACT | Status: DC | PRN

## 2021-03-05 MED ORDER — EPINEPHRINE 0.3 MG/0.3ML IJ SOAJ: 0.3000 mg | Freq: Once | INTRAMUSCULAR | Status: DC | PRN

## 2021-03-05 MED ORDER — METHYLPREDNISOLONE SODIUM SUCC 125 MG IJ SOLR: 125.0000 mg | Freq: Once | INTRAMUSCULAR | Status: DC | PRN

## 2021-03-05 NOTE — Progress Notes (Signed)
Diagnosis: Iron Deficiency Anemia  Provider:  Chilton Greathouse, MD  Procedure: Infusion  IV Type: Peripheral, IV Location: R Antecubital  Venofer (Iron Sucrose), Dose: 200 mg  Infusion Start Time: 1429  Infusion Stop Time: 1446  Post Infusion IV Care: Peripheral IV Discontinued  Discharge: Condition: Good, Destination: Home . AVS provided to patient.   Performed by:  Bonnetta Allbee, Lyman Speller, LPN

## 2021-03-12 ENCOUNTER — Other Ambulatory Visit: Payer: Self-pay

## 2021-03-12 ENCOUNTER — Ambulatory Visit (INDEPENDENT_AMBULATORY_CARE_PROVIDER_SITE_OTHER): Payer: BC Managed Care – PPO

## 2021-03-12 VITALS — BP 110/70 | HR 58 | Temp 98.2°F | Resp 16 | Ht 63.0 in | Wt 188.4 lb

## 2021-03-12 DIAGNOSIS — E611 Iron deficiency: Secondary | ICD-10-CM

## 2021-03-12 MED ORDER — SODIUM CHLORIDE 0.9 % IV SOLN: Freq: Once | INTRAVENOUS | Status: DC | PRN

## 2021-03-12 MED ORDER — SODIUM CHLORIDE 0.9 % IV SOLN
200.0000 mg | Freq: Once | INTRAVENOUS | Status: AC
  Administered 2021-03-12: 200 mg via INTRAVENOUS
  Filled 2021-03-12: qty 10

## 2021-03-12 MED ORDER — DIPHENHYDRAMINE HCL 50 MG/ML IJ SOLN: 50.0000 mg | Freq: Once | INTRAMUSCULAR | Status: DC | PRN

## 2021-03-12 MED ORDER — ALBUTEROL SULFATE HFA 108 (90 BASE) MCG/ACT IN AERS: 2.0000 | INHALATION_SPRAY | Freq: Once | RESPIRATORY_TRACT | Status: DC | PRN

## 2021-03-12 MED ORDER — ACETAMINOPHEN 325 MG PO TABS
650.0000 mg | ORAL_TABLET | Freq: Once | ORAL | Status: AC
  Administered 2021-03-12: 650 mg via ORAL
  Filled 2021-03-12: qty 2

## 2021-03-12 MED ORDER — FAMOTIDINE IN NACL 20-0.9 MG/50ML-% IV SOLN: 20.0000 mg | Freq: Once | INTRAVENOUS | Status: DC | PRN

## 2021-03-12 MED ORDER — DIPHENHYDRAMINE HCL 25 MG PO CAPS
50.0000 mg | ORAL_CAPSULE | Freq: Once | ORAL | Status: AC
  Administered 2021-03-12: 50 mg via ORAL
  Filled 2021-03-12: qty 2

## 2021-03-12 MED ORDER — EPINEPHRINE 0.3 MG/0.3ML IJ SOAJ: 0.3000 mg | Freq: Once | INTRAMUSCULAR | Status: DC | PRN

## 2021-03-12 MED ORDER — METHYLPREDNISOLONE SODIUM SUCC 125 MG IJ SOLR: 125.0000 mg | Freq: Once | INTRAMUSCULAR | Status: DC | PRN

## 2021-03-12 NOTE — Progress Notes (Signed)
Diagnosis: Iron Deficiency Anemia  Provider:  Chilton Greathouse, MD  Procedure: Infusion  IV Type: Peripheral, IV Location: R Antecubital  Venofer (Iron Sucrose), Dose: 200 mg  Infusion Start Time: 1152  Infusion Stop Time: 1214  Post Infusion IV Care: Peripheral IV Discontinued  Discharge: Condition: Good, Destination: Home . AVS provided to patient.   Performed by:  Ival Pacer, Lyman Speller, LPN

## 2021-04-02 ENCOUNTER — Inpatient Hospital Stay: Payer: BC Managed Care – PPO | Admitting: Hematology and Oncology

## 2021-04-02 ENCOUNTER — Other Ambulatory Visit: Payer: Self-pay | Admitting: Hematology and Oncology

## 2021-04-02 ENCOUNTER — Other Ambulatory Visit: Payer: Self-pay

## 2021-04-02 ENCOUNTER — Inpatient Hospital Stay: Payer: BC Managed Care – PPO | Attending: Hematology and Oncology

## 2021-04-02 VITALS — BP 123/87 | HR 63 | Temp 97.1°F | Resp 17 | Wt 197.9 lb

## 2021-04-02 DIAGNOSIS — Z9884 Bariatric surgery status: Secondary | ICD-10-CM | POA: Insufficient documentation

## 2021-04-02 DIAGNOSIS — D509 Iron deficiency anemia, unspecified: Secondary | ICD-10-CM | POA: Insufficient documentation

## 2021-04-02 DIAGNOSIS — E611 Iron deficiency: Secondary | ICD-10-CM

## 2021-04-02 LAB — CMP (CANCER CENTER ONLY)
ALT: 42 U/L (ref 0–44)
AST: 22 U/L (ref 15–41)
Albumin: 4.1 g/dL (ref 3.5–5.0)
Alkaline Phosphatase: 72 U/L (ref 38–126)
Anion gap: 5 (ref 5–15)
BUN: 14 mg/dL (ref 6–20)
CO2: 26 mmol/L (ref 22–32)
Calcium: 8.3 mg/dL — ABNORMAL LOW (ref 8.9–10.3)
Chloride: 109 mmol/L (ref 98–111)
Creatinine: 0.6 mg/dL (ref 0.44–1.00)
GFR, Estimated: >60 mL/min (ref >60)
Glucose, Bld: 90 mg/dL (ref 70–99)
Potassium: 4 mmol/L (ref 3.5–5.1)
Sodium: 140 mmol/L (ref 135–145)
Total Bilirubin: 0.6 mg/dL (ref 0.3–1.2)
Total Protein: 6.5 g/dL (ref 6.5–8.1)

## 2021-04-02 LAB — CBC WITH DIFFERENTIAL (CANCER CENTER ONLY)
Abs Immature Granulocytes: 0.01 10*3/uL (ref 0.00–0.07)
Basophils Absolute: 0 10*3/uL (ref 0.0–0.1)
Basophils Relative: 1 %
Eosinophils Absolute: 0.2 10*3/uL (ref 0.0–0.5)
Eosinophils Relative: 3 %
HCT: 37.4 % (ref 36.0–46.0)
Hemoglobin: 12.2 g/dL (ref 12.0–15.0)
Immature Granulocytes: 0 %
Lymphocytes Relative: 27 %
Lymphs Abs: 1.4 10*3/uL (ref 0.7–4.0)
MCH: 29.7 pg (ref 26.0–34.0)
MCHC: 32.6 g/dL (ref 30.0–36.0)
MCV: 91 fL (ref 80.0–100.0)
Monocytes Absolute: 0.4 10*3/uL (ref 0.1–1.0)
Monocytes Relative: 7 %
Neutro Abs: 3.3 10*3/uL (ref 1.7–7.7)
Neutrophils Relative %: 62 %
Platelet Count: 204 10*3/uL (ref 150–400)
RBC: 4.11 MIL/uL (ref 3.87–5.11)
RDW: 14.9 % (ref 11.5–15.5)
WBC Count: 5.3 10*3/uL (ref 4.0–10.5)
nRBC: 0 % (ref 0.0–0.2)

## 2021-04-02 LAB — RETIC PANEL
Immature Retic Fract: 6 % (ref 2.3–15.9)
RBC.: 4.15 MIL/uL (ref 3.87–5.11)
Retic Count, Absolute: 82.6 10*3/uL (ref 19.0–186.0)
Retic Ct Pct: 2 % (ref 0.4–3.1)
Reticulocyte Hemoglobin: 34.3 pg (ref >27.9)

## 2021-04-02 LAB — IRON AND TIBC
Iron: 59 ug/dL (ref 41–142)
Saturation Ratios: 17 % — ABNORMAL LOW (ref 21–57)
TIBC: 354 ug/dL (ref 236–444)
UIBC: 295 ug/dL (ref 120–384)

## 2021-04-02 LAB — FERRITIN: Ferritin: 61 ng/mL (ref 11–307)

## 2021-04-02 NOTE — Progress Notes (Signed)
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 female with medical history significant for iron deficiency anemia secondary to gastric bypass surgery who presents for a follow up visit. The patient's last visit was on 02/05/2021. In the interim since the last visit she completed 5 doses of IV Venofer 200 mg.  On exam today Katie [name] notes that she feels well overall.  She notes that she has not seen any marked improvement in her energy levels with the iron infusion.  She states that there has "not been a lot of change".  She did report some fatigue following the iron infusions but does report she responds poorly to Benadryl which she received as a premedication.  She notes that she has not had any other overt signs or symptoms of bleeding in the interim since her last visit.  She denies any fevers, chills, sweats, nausea, vomiting or diarrhea.  Full 10 point ROS is listed below.  MEDICAL HISTORY:  Past Medical History:  Diagnosis Date   AMA (advanced maternal age) multigravida 35+    Anemia    WITH PREGNANCY   Anxiety    Arthritis    OSTEOARTHRITIS   Asthma    as a child   Carpal tunnel syndrome    only first pregnancy   Gestational diabetes    Morbid obesity (HCC)    PONV (postoperative nausea and vomiting)    Sleep apnea    USES C-PAP    SURGICAL HISTORY: Past Surgical History:   Procedure Laterality Date   BARIATRIC SURGERY  2018   CESAREAN SECTION WITH BILATERAL TUBAL LIGATION Bilateral 04/08/2016   Procedure: CESAREAN SECTION WITH BILATERAL TUBAL LIGATION;  Surgeon: Maxie Better, MD;  [facility];  Service: Obstetrics;  Laterality: Bilateral;   CHOLECYSTECTOMY     2002   DILATATION & CURETTAGE/HYSTEROSCOPY WITH MYOSURE N/A 01/02/2018   Procedure: DILATATION & CURETTAGE/HYSTEROSCOPY;  Surgeon: Maxie Better, MD;  Location: [facility];  Service: Gynecology;  Laterality: N/A;   HYSTEROSCOPY WITH NOVASURE N/A 01/02/2018   Procedure: HYSTEROSCOPY WITH NOVASURE;  Surgeon: Maxie Better, MD;  Location: [facility];  Service: Gynecology;  Laterality: N/A;   KNEE SURGERY Right 03/19/2014   orthoscopic arthroscopy   TONSILLECTOMY AND ADENOIDECTOMY      SOCIAL HISTORY: Social History   Socioeconomic History   Marital status: Media planner    Spouse name: Not on file   Number of children: Not on file   Years of education: Not on file   Highest education level: Not on file  Occupational History   Not on file  Tobacco Use   Smoking status: Never   Smokeless tobacco: Never  Vaping Use   Vaping Use: Never used  Substance and Sexual Activity   Alcohol use: Not Currently    Alcohol/week: 0.0 standard drinks    Comment: occ   Drug use: No   Sexual activity: Yes    Birth control/protection: None  Other Topics Concern   Not on file  Social History Narrative   Not on file   Social Determinants of Health   Financial Resource Strain: Not on file  Food Insecurity: Not on file  Transportation Needs: Not on file  Physical Activity: Not on file  Stress: Not on file  Social Connections: Not on file  Intimate Partner Violence: Not on file    FAMILY HISTORY: Family History  Problem Relation Age of Onset   Hypertension Mother    Thyroid disease Mother    Migraines Mother    Diabetes Mother    Multiple sclerosis Mother    Diabetes  Maternal Grandfather    Anesthesia problems Neg Hx    Hypotension Neg Hx    Malignant hyperthermia Neg Hx    Pseudochol deficiency Neg Hx     ALLERGIES:  is allergic to penicillins and propofol.  MEDICATIONS:  Current Outpatient Medications  Medication Sig Dispense Refill   Calcium 200 MG TABS Take 400 mg by mouth 3 (three) times daily.     Cholecalciferol (VITAMIN D-3 PO) Take 125 mcg by mouth daily.     ferrous sulfate 325 (65 FE) MG tablet Take 325 mg by mouth daily with breakfast. (Patient not taking: Reported on 02/05/2021)     Multiple Vitamin (MULTIVITAMIN WITH MINERALS) TABS tablet Take 1 tablet by mouth daily.     omeprazole (PRILOSEC) 20 MG capsule Take 20 mg by mouth daily.  1   sertraline (ZOLOFT) 100 MG tablet Take 100 mg by mouth every evening.      No current facility-administered medications for this visit.    REVIEW OF SYSTEMS:   Constitutional: ( - ) fevers, ( - )  chills , ( - ) night sweats Eyes: ( - ) blurriness of vision, ( - ) double vision, ( - ) watery eyes Ears, nose, mouth, throat, and face: ( - ) mucositis, ( - ) sore throat Respiratory: ( - ) cough, ( - ) dyspnea, ( - ) wheezes Cardiovascular: ( - ) palpitation, ( - ) chest discomfort, ( - ) lower extremity swelling Gastrointestinal:  ( - ) nausea, ( - ) heartburn, ( - ) change in bowel habits Skin: ( - ) abnormal skin rashes Lymphatics: ( - ) new lymphadenopathy, ( - ) easy bruising Neurological: ( - ) numbness, ( - ) tingling, ( - ) new weaknesses Behavioral/Psych: ( - ) mood change, ( - ) new changes  All other systems were reviewed with the patient and are negative.  PHYSICAL EXAMINATION: ECOG PERFORMANCE STATUS: 0 - Asymptomatic  Vitals:   04/02/21 1050  BP: 123/87  Pulse: 63  Resp: 17  Temp: (!) 97.1 F (36.2 C)  SpO2: 99%   Filed Weights   04/02/21 1050  Weight: 197 lb 14.4 oz (89.8 kg)    GENERAL: Well-appearing middle-age Caucasian female, alert, no distress and  comfortable SKIN: skin color, texture, turgor are normal, no rashes or significant lesions EYES: conjunctiva are pink and non-injected, sclera clear LUNGS: clear to auscultation and percussion with normal breathing effort HEART: regular rate & rhythm and no murmurs and non pitting lower extremity edema bilaterally, stable from prior Musculoskeletal: no cyanosis of digits and no clubbing  PSYCH: alert & oriented x 3, fluent speech NEURO: no focal motor/sensory deficits  LABORATORY DATA:  I have reviewed the data as listed CBC Latest Ref Rng & Units 04/02/2021 02/05/2021 01/02/2018  WBC 4.0 - 10.5 K/uL 5.3 5.7 6.8  Hemoglobin 12.0 -  15.0 g/dL 12.2 11.5(L) 12.4  Hematocrit 36.0 - 46.0 % 37.4 35.6(L) 38.5  Platelets 150 - 400 K/uL 204 277 273    CMP Latest Ref Rng & Units 04/02/2021 02/05/2021 04/06/2016  Glucose 70 - 99 mg/dL 90 98 113(H)  BUN 6 - 20 mg/dL 14 16 16   Creatinine 0.44 - 1.00 mg/dL 0.60 0.65 0.59  Sodium 135 - 145 mmol/L 140 141 136  Potassium 3.5 - 5.1 mmol/L 4.0 3.9 4.7  Chloride 98 - 111 mmol/L 109 111 106  CO2 22 - 32 mmol/L 26 22 23   Calcium 8.9 - 10.3 mg/dL 8.3(L) 8.2(L) 9.1  Total Protein 6.5 - 8.1 g/dL 6.5 6.4(L) -  Total Bilirubin 0.3 - 1.2 mg/dL 0.6 0.6 -  Alkaline Phos 38 - 126 U/L 72 80 -  AST 15 - 41 U/L 22 35 -  ALT 0 - 44 U/L 42 63(H) -    RADIOGRAPHIC STUDIES: No results found.  ASSESSMENT & PLAN Katie [name] 45 y.o. female with medical history significant for iron deficiency anemia secondary to gastric bypass surgery who presents for a follow up visit.   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 --Ferritin level has increased from 4 up to 61 --No change in the patient's symptoms --Discussed with patient that due to her weight loss surgery it is unlikely that she will be able to appropriately absorb iron in the future.  She may become iron deficient  again. --Patient notes she would like to continue to follow with her PCP and weight loss center to have them check her iron levels. --Recommend routine CBCs be collected with her PCP and weight loss center at least once yearly.  Recommend prompt rereferral in the event she were to develop anemia or symptoms concerning for worsening anemia.  No orders of the defined types were placed in this encounter.   All questions were answered. The patient knows to call the clinic with any problems, questions or concerns.  A total of more than 30 minutes were spent on this encounter with face-to-face time and non-face-to-face time, including preparing to see the patient, ordering tests and/or medications, counseling the patient and coordination of care as outlined above.   Ledell Peoples, MD Department of Hematology/Oncology [place] [facility] Phone: [phone] Pager: [phone] Email: [email or web address]  04/02/2021 3:21 PM

## 2023-06-02 DIAGNOSIS — K648 Other hemorrhoids: Secondary | ICD-10-CM | POA: Diagnosis not present

## 2023-08-03 DIAGNOSIS — F329 Major depressive disorder, single episode, unspecified: Secondary | ICD-10-CM | POA: Diagnosis not present

## 2023-08-16 DIAGNOSIS — F329 Major depressive disorder, single episode, unspecified: Secondary | ICD-10-CM | POA: Diagnosis not present

## 2023-11-29 DIAGNOSIS — F329 Major depressive disorder, single episode, unspecified: Secondary | ICD-10-CM | POA: Diagnosis not present

## 2024-01-22 DIAGNOSIS — F329 Major depressive disorder, single episode, unspecified: Secondary | ICD-10-CM | POA: Diagnosis not present

## 2024-02-14 ENCOUNTER — Ambulatory Visit: Admitting: Internal Medicine

## 2024-02-14 ENCOUNTER — Encounter: Payer: Self-pay | Admitting: Internal Medicine

## 2024-02-14 VITALS — BP 122/72 | HR 72 | Ht 63.0 in | Wt 213.0 lb

## 2024-02-14 DIAGNOSIS — K602 Anal fissure, unspecified: Secondary | ICD-10-CM

## 2024-02-14 MED ORDER — DILTIAZEM GEL 2 %: 1.0000 | Freq: Three times a day (TID) | CUTANEOUS | 3 refills | Status: DC

## 2024-02-14 NOTE — Patient Instructions (Addendum)
 We have sent a prescription for Diltiazem gel to [facility]. You should apply a pea size amount to your rectum three times daily x 6-8 weeks.  [facility]'s information is below: Address: [address] [place], [place] [address]  [phone]  *Please DO NOT go directly from our office to pick up this medication! Give the pharmacy 1 day to process the prescription as this is compounded and takes time to make.  _______________________________________________________  If your blood pressure at your visit was 140/90 or greater, please contact your primary care physician to follow up on this.  _______________________________________________________  If you are age 46 or older, your body mass index should be between 23-30. Your Body mass index is 37.73 kg/m. If this is out of the aforementioned range listed, please consider follow up with your Primary Care Provider.  If you are age 53 or younger, your body mass index should be between 19-25. Your Body mass index is 37.73 kg/m. If this is out of the aformentioned range listed, please consider follow up with your Primary Care Provider.   ________________________________________________________  The [facility] providers would like to encourage you to use MYCHART to communicate with providers for non-urgent requests or questions.  Due to long hold times on the telephone, sending your provider a message by [facility] may be a faster and more efficient way to get a response.  Please allow 48 business hours for a response.  Please remember that this is for non-urgent requests.  _______________________________________________________  [facility] is using a team-based approach to care.  Your team is made up of your doctor and two to three APPS. Our APPS (Nurse Practitioners and Physician Assistants) work with your physician to ensure care continuity for you. They are fully qualified to address your health concerns and  develop a treatment plan. They communicate directly with your gastroenterologist to care for you. Seeing the Advanced Practice Practitioners on your physician's team can help you by facilitating care more promptly, often allowing for earlier appointments, access to diagnostic testing, procedures, and other specialty referrals.

## 2024-02-14 NOTE — Progress Notes (Signed)
 HISTORY OF PRESENT ILLNESS:  Katie [name] is a 48 y.o. 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  She is self-referred today regarding persistent hemorrhoid as manifested by rectal pain and rectal bleeding.  Patient tells me that she underwent colonoscopy elsewhere 10 to 15 years ago.  This was unremarkable.  In January of this year she developed problems with rectal pain and bleeding with defecation.  She was prescribed a cream by her PCP and engaged in sitz bath's.  This helped some.  However, problems have continued intermittently.  She did have pain and some bleeding as recently as last night and again this morning.  She describes her bowel habits as loose at times.  There is question of ulcerative colitis in her mother.  Review of records shows a virtual office visit with an FNP January 2025.  Prescribed Anusol . Upper GI series April 2018 revealed a small sliding hiatal hernia no other abnormalities. CT scan February 2022 prior bariatric surgery.  Large ventral hernia.  Hepatomegaly.  The patient's GI review of systems is otherwise remarkable for chronic GERD for which she takes omeprazole 20 mg.  This is effective.  No dysphagia.  REVIEW OF SYSTEMS:  All non-GI ROS negative unless otherwise stated in the HPI. Past Medical History:  Diagnosis Date   AMA (advanced maternal age) multigravida 35+    Anemia    WITH PREGNANCY   Anxiety    Arthritis    OSTEOARTHRITIS   Asthma    as a child   Carpal tunnel syndrome    only first pregnancy   Gestational diabetes    Hemorrhoid    Morbid obesity (HCC)    PONV (postoperative nausea and vomiting)    Sleep apnea    USES C-PAP    Past Surgical History:  Procedure Laterality Date   BARIATRIC SURGERY  2018   CESAREAN SECTION WITH BILATERAL  TUBAL LIGATION Bilateral 04/08/2016   Procedure: CESAREAN SECTION WITH BILATERAL TUBAL LIGATION;  Surgeon: Dickie Carder, MD;  [facility];  Service: Obstetrics;  Laterality: Bilateral;   CHOLECYSTECTOMY     2002   DILATATION & CURETTAGE/HYSTEROSCOPY WITH MYOSURE N/A 01/02/2018   Procedure: DILATATION & CURETTAGE/HYSTEROSCOPY;  Surgeon: Carder Dickie, MD;  Location: [facility];  Service: Gynecology;  Laterality: N/A;   HYSTEROSCOPY WITH NOVASURE N/A 01/02/2018   Procedure: HYSTEROSCOPY WITH NOVASURE;  Surgeon: Carder Dickie, MD;  Location: [facility];  Service: Gynecology;  Laterality: N/A;   KNEE SURGERY Right 03/19/2014   orthoscopic arthroscopy   TONSILLECTOMY AND ADENOIDECTOMY      Social History TERESITA FANTON  reports that she has never smoked. She has never used smokeless tobacco. She reports that she does not currently use alcohol. She reports that she does not use drugs.  family history includes Diabetes in her maternal grandfather and mother; Hypertension in her mother; Migraines in her mother; Multiple sclerosis in her mother; Thyroid disease in her mother.  Allergies  Allergen Reactions   Penicillins Hives    Has patient had a PCN reaction causing immediate rash, facial/tongue/throat swelling, SOB or lightheadedness with hypotension: no Has patient had a PCN reaction causing severe rash involving mucus membranes or skin necrosis: no Has patient had a PCN reaction that required hospitalization no Has patient had a PCN reaction occurring within the last 10 years: yes If all of  the above answers are NO, then may proceed with Cephalosporin use.    Propofol  Nausea And Vomiting    Abnormally sick       PHYSICAL EXAMINATION: Vital signs: BP 122/72   Pulse 72   Ht 5' 3 (1.6 m)   Wt 213 lb (96.6 kg)   BMI 37.73 kg/m   Constitutional: Obese but generally well-appearing, no acute distress Psychiatric: alert and oriented x3,  cooperative Eyes: extraocular movements intact, anicteric, conjunctiva pink Mouth: oral pharynx moist, no lesions Neck: supple no lymphadenopathy Cardiovascular: heart regular rate and rhythm, no murmur Lungs: clear to auscultation bilaterally Abdomen: soft, nontender, nondistended, no obvious ascites, no peritoneal signs, normal bowel sounds, no organomegaly.  Multiple surgical incisions well-healed Rectal: Small external tags.  Tender posterior fissure.  Brown stool without blood Extremities: no clubbing or cyanosis.  1+ lower extremity edema bilaterally Skin: no lesions on visible extremities save tattoos Neuro: No focal deficits.  Cranial nerves intact  ASSESSMENT:  1.  Posterior anal fissure.  Symptomatic. 2.  Reports colonoscopy elsewhere 10 to 15 years ago, unremarkable. 3.  Chronic GERD.  Controlled with PPI 4.  Morbid obesity status post duodenal switch procedure 2018 5.  Status post cholecystectomy 2002 6.  Status post ventral hernia repair 2020   PLAN:  1.  Prescribed 2% diltiazem ointment.  Apply 5 times daily as instructed 2.  Citrucel 2 tablespoons daily 3.  Sitz bath's daily 4.  Office follow-up 6 weeks 5.  At age 21, could consider screening colonoscopy at this time.  We discussed this issue. 6.  Reflux precautions 7.  Continue PPI.  Lowest dose to control symptoms Total time of 60 minutes was spent preparing to see the patient, obtaining comprehensive history, performing medically appropriate physical exam, counseling and educating the patient regarding the above listed issues, ordering medication, arranging follow-up, and documenting clinical information in the health record

## 2024-02-22 NOTE — Progress Notes (Unsigned)
 Cardiology Office Note:    Date:  02/22/2024   ID:  Katie [name], DOB 23-Dec-1975, MRN [id number]  PCP:  Joylene Lamar SQUIBB, MD   [facility] Providers Cardiologist:  None { Click to update primary MD,subspecialty MD or APP then REFRESH:1}    Referring MD: Juliane Che, PA   No chief complaint on file. ***  History of Present Illness:    Katie [name] is a 48 y.o. female is seen at the request of Che Purchase PA for evaluation of palpitations. She has a history of Obesity with OSA.   Past Medical History:  Diagnosis Date   AMA (advanced maternal age) multigravida 35+    Anemia    WITH PREGNANCY   Anxiety    Arthritis    OSTEOARTHRITIS   Asthma    as a child   Carpal tunnel syndrome    only first pregnancy   Gestational diabetes    Hemorrhoid    Morbid obesity (HCC)    PONV (postoperative nausea and vomiting)    Sleep apnea    USES C-PAP    Past Surgical History:  Procedure Laterality Date   BARIATRIC SURGERY  2018   CESAREAN SECTION WITH BILATERAL TUBAL LIGATION Bilateral 04/08/2016   Procedure: CESAREAN SECTION WITH BILATERAL TUBAL LIGATION;  Surgeon: Dickie Carder, MD;  [facility];  Service: Obstetrics;  Laterality: Bilateral;   CHOLECYSTECTOMY     2002   DILATATION & CURETTAGE/HYSTEROSCOPY WITH MYOSURE N/A 01/02/2018   Procedure: DILATATION & CURETTAGE/HYSTEROSCOPY;  Surgeon: Carder Dickie, MD;  Location: [facility];  Service: Gynecology;  Laterality: N/A;   HYSTEROSCOPY WITH NOVASURE N/A 01/02/2018   Procedure: HYSTEROSCOPY WITH NOVASURE;  Surgeon: Carder Dickie, MD;  Location: [facility];  Service: Gynecology;  Laterality: N/A;   KNEE SURGERY Right 03/19/2014   orthoscopic arthroscopy   TONSILLECTOMY AND ADENOIDECTOMY      Current Medications: No outpatient medications have been marked as taking for the 02/23/24 encounter (Appointment) with [place], Yitzchok Carriger M, MD.     Allergies:   Penicillins and  Propofol    Social History   Socioeconomic History   Marital status: Media planner    Spouse name: Not on file   Number of children: Not on file   Years of education: Not on file   Highest education level: Not on file  Occupational History   Not on file  Tobacco Use   Smoking status: Never   Smokeless tobacco: Never  Vaping Use   Vaping status: Never Used  Substance and Sexual Activity   Alcohol use: Not Currently    Alcohol/week: 0.0 standard drinks of alcohol    Comment: occ   Drug use: No   Sexual activity: Yes    Birth control/protection: None  Other Topics Concern   Not on file  Social History Narrative   Not on file   Social Drivers of Health   Financial Resource Strain: Patient Declined (02/13/2024)   Received from [facility]   Overall Financial Resource Strain (CARDIA)    How hard is it for you to pay for the very basics like food, housing, medical care, and heating?: Patient declined  Food Insecurity: No Food Insecurity (02/13/2024)   Received from [facility]   Hunger Vital Sign    Within the past 12 months, you worried that your food would run out before you got the money to buy more.: Never true    Within the past 12 months, the food you bought just didn't  last and you didn't have money to get more.: Never true  Transportation Needs: No Transportation Needs (02/13/2024)   Received from [facility] - Transportation    In the past 12 months, has lack of transportation kept you from medical appointments or from getting medications?: No    In the past 12 months, has lack of transportation kept you from meetings, work, or from getting things needed for daily living?: No  Physical Activity: Insufficiently Active (02/13/2024)   Received from [facility]   Exercise Vital Sign    On average, how many days per week do you engage in moderate to strenuous exercise (like a brisk walk)?: 2 days    On average, how many minutes do you engage in exercise  at this level?: 30 min  Stress: Stress Concern Present (02/13/2024)   Received from [facility] - Occupational Stress Questionnaire    Do you feel stress - tense, restless, nervous, or anxious, or unable to sleep at night because your mind is troubled all the time - these days?: Rather much  Social Connections: Moderately Integrated (02/13/2024)   Received from [facility]   Social Network    How would you rate your social network (family, work, friends)?: Adequate participation with social networks     Family History: The patient's ***family history includes Diabetes in her maternal grandfather and mother; Hypertension in her mother; Migraines in her mother; Multiple sclerosis in her mother; Thyroid disease in her mother. There is no history of Anesthesia problems, Hypotension, Malignant hyperthermia, or Pseudochol deficiency.  ROS:   Please see the history of present illness.    *** All other systems reviewed and are negative.  EKGs/Labs/Other Studies Reviewed:    The following studies were reviewed today: ***      Recent Labs: No results found for requested labs within last 365 days.  Recent Lipid Panel    Component Value Date/Time   CHOL 122 09/14/2011 1702   TRIG 64 09/14/2011 1702   HDL 38 (L) 09/14/2011 1702   CHOLHDL 3.2 09/14/2011 1702   VLDL 13 09/14/2011 1702   LDLCALC 71 09/14/2011 1702     Risk Assessment/Calculations:   {Does this patient have ATRIAL FIBRILLATION?:[phone]}  No BP recorded.  {Refresh Note OR Click here to enter BP  :1}***         Physical Exam:    VS:  There were no vitals taken for this visit.    Wt Readings from Last 3 Encounters:  02/14/24 213 lb (96.6 kg)  04/02/21 197 lb 14.4 oz (89.8 kg)  03/12/21 188 lb 6.4 oz (85.5 kg)     GEN: *** Well nourished, well developed in no acute distress HEENT: Normal NECK: No JVD; No carotid bruits LYMPHATICS: No lymphadenopathy CARDIAC:  ***RRR, no murmurs, rubs, gallops RESPIRATORY:  Clear to auscultation without rales, wheezing or rhonchi  ABDOMEN: Soft, non-tender, non-distended MUSCULOSKELETAL:  No edema; No deformity  SKIN: Warm and dry NEUROLOGIC:  Alert and oriented x 3 PSYCHIATRIC:  Normal affect   ASSESSMENT:    No diagnosis found. PLAN:    In order of problems listed above:  ***      {Are you ordering a CV Procedure (e.g. stress test, cath, DCCV, TEE, etc)?   Press F2        :[id number]}    Medication Adjustments/Labs and Tests Ordered: Current medicines are reviewed at length with the patient today.  Concerns regarding medicines  are outlined above.  No orders of the defined types were placed in this encounter.  No orders of the defined types were placed in this encounter.   There are no Patient Instructions on file for this visit.   Signed, Guadalupe Kerekes [place], MD  02/22/2024 8:06 AM    [facility]

## 2024-02-23 ENCOUNTER — Encounter: Payer: Self-pay | Admitting: Cardiology

## 2024-02-23 ENCOUNTER — Ambulatory Visit: Attending: Cardiology | Admitting: Cardiology

## 2024-02-23 VITALS — BP 100/70 | HR 77 | Ht 63.0 in | Wt 211.0 lb

## 2024-02-23 DIAGNOSIS — I493 Ventricular premature depolarization: Secondary | ICD-10-CM | POA: Diagnosis present

## 2024-02-23 DIAGNOSIS — R002 Palpitations: Secondary | ICD-10-CM | POA: Diagnosis present

## 2024-02-23 DIAGNOSIS — G4733 Obstructive sleep apnea (adult) (pediatric): Secondary | ICD-10-CM | POA: Insufficient documentation

## 2024-02-23 NOTE — Patient Instructions (Addendum)
 Medication Instructions:  Continue same medications  Lab Work: None ordered  Testing/Procedures: Echo  first available  Follow-Up: At [facility], you and your health needs are our priority.  As part of our continuing mission to provide you with exceptional heart care, our providers are all part of one team.  This team includes your primary Cardiologist (physician) and Advanced Practice Providers or APPs (Physician Assistants and Nurse Practitioners) who all work together to provide you with the care you need, when you need it.  Your next appointment:  To Be Determined after test    Provider:  Dr.Jordan  No Caffeine    We recommend signing up for the patient portal called MyChart.  Sign up information is provided on this After Visit Summary.  MyChart is used to connect with patients for Virtual Visits (Telemedicine).  Patients are able to view lab/test results, encounter notes, upcoming appointments, etc.  Non-urgent messages can be sent to your provider as well.   To learn more about what you can do with MyChart, go to [email or web address].

## 2024-02-26 ENCOUNTER — Other Ambulatory Visit: Payer: Self-pay | Admitting: Cardiology

## 2024-02-26 DIAGNOSIS — I493 Ventricular premature depolarization: Secondary | ICD-10-CM

## 2024-02-26 DIAGNOSIS — G4733 Obstructive sleep apnea (adult) (pediatric): Secondary | ICD-10-CM

## 2024-02-26 DIAGNOSIS — R002 Palpitations: Secondary | ICD-10-CM

## 2024-02-29 DIAGNOSIS — R351 Nocturia: Secondary | ICD-10-CM | POA: Diagnosis not present

## 2024-02-29 DIAGNOSIS — N3946 Mixed incontinence: Secondary | ICD-10-CM | POA: Diagnosis not present

## 2024-02-29 DIAGNOSIS — R350 Frequency of micturition: Secondary | ICD-10-CM | POA: Diagnosis not present

## 2024-03-07 ENCOUNTER — Ambulatory Visit (HOSPITAL_BASED_OUTPATIENT_CLINIC_OR_DEPARTMENT_OTHER)
Admission: RE | Admit: 2024-03-07 | Discharge: 2024-03-07 | Disposition: A | Discharge status: Home / Self Care | Source: Ambulatory Visit | Attending: Cardiology | Admitting: Cardiology

## 2024-03-07 ENCOUNTER — Ambulatory Visit: Payer: Self-pay | Admitting: Cardiology

## 2024-03-07 DIAGNOSIS — I493 Ventricular premature depolarization: Secondary | ICD-10-CM | POA: Diagnosis not present

## 2024-03-07 DIAGNOSIS — R002 Palpitations: Secondary | ICD-10-CM | POA: Diagnosis not present

## 2024-03-07 LAB — ECHOCARDIOGRAM COMPLETE
AR max vel: 3.08 cm2
AV Area VTI: 3.15 cm2
AV Area mean vel: 3.22 cm2
AV Mean grad: 3.5 mmHg
AV Peak grad: 6.2 mmHg
Ao pk vel: 1.25 m/s
Area-P 1/2: 4.96 cm2
Calc EF: 59.9 %
S' Lateral: 3 cm
Single Plane A2C EF: 60.2 %
Single Plane A4C EF: 60 %

## 2024-03-08 ENCOUNTER — Encounter: Payer: Self-pay | Admitting: Cardiology

## 2024-03-08 ENCOUNTER — Telehealth: Payer: Self-pay | Admitting: Cardiology

## 2024-03-08 DIAGNOSIS — Z1231 Encounter for screening mammogram for malignant neoplasm of breast: Secondary | ICD-10-CM | POA: Diagnosis not present

## 2024-03-08 NOTE — Telephone Encounter (Signed)
 Spoke to patient echo results given.Stated she is having frequent palpitations and would like to try a beta blocker.Advised I will send message to Dr.Jordan.

## 2024-03-08 NOTE — Telephone Encounter (Signed)
 Pt called I'm returning call to [facility]  for echo results

## 2024-03-15 MED ORDER — METOPROLOL SUCCINATE ER 25 MG PO TB24: 25.0000 mg | ORAL_TABLET | Freq: Every day | ORAL | 3 refills | Status: AC

## 2024-03-15 NOTE — Telephone Encounter (Signed)
 Left detailed message for patient of the recommendations. New script sent to the pharmacy she is to call to make follow up appointment.

## 2024-03-18 NOTE — Telephone Encounter (Signed)
 Called patient left message on personal voice mail to call back to schedule follow up appointment with Dr.Jordan.

## 2024-03-19 NOTE — Telephone Encounter (Signed)
 Spoke to patient she stated someone has already called her.Stated she will start taking Toprol XL 25 mg daily today.Follow up appointment scheduled with Dr.Jordan 12/11 at 11:20 am.

## 2024-03-22 DIAGNOSIS — N3946 Mixed incontinence: Secondary | ICD-10-CM | POA: Diagnosis not present

## 2024-04-02 ENCOUNTER — Ambulatory Visit: Admitting: Cardiovascular Disease

## 2024-04-04 ENCOUNTER — Ambulatory Visit (INDEPENDENT_AMBULATORY_CARE_PROVIDER_SITE_OTHER): Admitting: Internal Medicine

## 2024-04-04 ENCOUNTER — Encounter: Payer: Self-pay | Admitting: Internal Medicine

## 2024-04-04 VITALS — BP 99/60 | HR 64 | Ht 64.0 in | Wt 216.4 lb

## 2024-04-04 DIAGNOSIS — R351 Nocturia: Secondary | ICD-10-CM | POA: Diagnosis not present

## 2024-04-04 DIAGNOSIS — N3281 Overactive bladder: Secondary | ICD-10-CM | POA: Diagnosis not present

## 2024-04-04 DIAGNOSIS — Z1211 Encounter for screening for malignant neoplasm of colon: Secondary | ICD-10-CM | POA: Diagnosis not present

## 2024-04-04 DIAGNOSIS — R350 Frequency of micturition: Secondary | ICD-10-CM | POA: Diagnosis not present

## 2024-04-04 DIAGNOSIS — K602 Anal fissure, unspecified: Secondary | ICD-10-CM

## 2024-04-04 DIAGNOSIS — N393 Stress incontinence (female) (male): Secondary | ICD-10-CM | POA: Diagnosis not present

## 2024-04-04 MED ORDER — NA SULFATE-K SULFATE-MG SULF 17.5-3.13-1.6 GM/177ML PO SOLN: 1.0000 | Freq: Once | ORAL | 0 refills | Status: AC

## 2024-04-04 NOTE — Patient Instructions (Signed)
 You have been scheduled for a colonoscopy. Please follow written instructions given to you at your visit today.   If you use inhalers (even only as needed), please bring them with you on the day of your procedure.  DO NOT TAKE 7 DAYS PRIOR TO TEST- Trulicity (dulaglutide) Ozempic, Wegovy (semaglutide) Mounjaro, Zepbound (tirzepatide) Bydureon Bcise (exanatide extended release)  DO NOT TAKE 1 DAY PRIOR TO YOUR TEST Rybelsus (semaglutide) Adlyxin (lixisenatide) Victoza (liraglutide) Byetta (exanatide) ___________________________________________________________________________  _______________________________________________________  If your blood pressure at your visit was 140/90 or greater, please contact your primary care physician to follow up on this.  _______________________________________________________  If you are age 27 or older, your body mass index should be between 23-30. Your Body mass index is 37.14 kg/m. If this is out of the aforementioned range listed, please consider follow up with your Primary Care Provider.  If you are age 67 or younger, your body mass index should be between 19-25. Your Body mass index is 37.14 kg/m. If this is out of the aformentioned range listed, please consider follow up with your Primary Care Provider.   ________________________________________________________  The [place] [facility] providers would like to encourage you to use MYCHART to communicate with providers for non-urgent requests or questions.  Due to long hold times on the telephone, sending your provider a message by [facility] may be a faster and more efficient way to get a response.  Please allow 48 business hours for a response.  Please remember that this is for non-urgent requests.  _______________________________________________________  [facility] is using a team-based approach to care.  Your team is made up of your doctor and two to three APPS. Our APPS (Nurse  Practitioners and Physician Assistants) work with your physician to ensure care continuity for you. They are fully qualified to address your health concerns and develop a treatment plan. They communicate directly with your gastroenterologist to care for you. Seeing the Advanced Practice Practitioners on your physician's team can help you by facilitating care more promptly, often allowing for earlier appointments, access to diagnostic testing, procedures, and other specialty referrals.

## 2024-04-04 NOTE — Progress Notes (Signed)
 HISTORY OF PRESENT ILLNESS:  Katie [name] is a 48 y.o. female, former psychiatric nurse, with morbid obesity status post laparoscopic duodenal switch procedure 2018, status postcholecystectomy 2002, status post ventral hernia repair 2020.  She was initially evaluated February 14, 2024 complaining of rectal pain and rectal bleeding.  At that time she was diagnosed with an anal fissure.  She was treated with diltiazem  ointment, Citrucel supplementation, and sitz bath's.  She presents today for follow-up as requested.  She is pleased to report that her problems with rectal pain and bleeding have resolved.  She does notice a skin tag when applying her medication.  She has no new complaints.  She did report having had a colonoscopy least 10 to 15 years ago.  We discussed colon cancer screening.  She is interested.  Laboratories: February 14, 2024.  Normal CBC with hemoglobin 12 point  REVIEW OF SYSTEMS:  All non-GI ROS negative except for anxiety, depression, headaches, heart rhythm change, lower extremity edema, and urinary leakage  Past Medical History:  Diagnosis Date   AMA (advanced maternal age) multigravida 35+    Anemia    WITH PREGNANCY   Anxiety    Arthritis    OSTEOARTHRITIS   Asthma    as a child   Carpal tunnel syndrome    only first pregnancy   Gestational diabetes    Hemorrhoid    Morbid obesity (HCC)    PONV (postoperative nausea and vomiting)    Sleep apnea    USES C-PAP    Past Surgical History:  Procedure Laterality Date   BARIATRIC SURGERY  2018   CESAREAN SECTION WITH BILATERAL TUBAL LIGATION Bilateral 04/08/2016   Procedure: CESAREAN SECTION WITH BILATERAL TUBAL LIGATION;  Surgeon: Dickie Carder, MD;  Location: [facility];  Service: Obstetrics;  Laterality: Bilateral;   CHOLECYSTECTOMY     2002   DILATATION & CURETTAGE/HYSTEROSCOPY WITH MYOSURE N/A 01/02/2018   Procedure: DILATATION & CURETTAGE/HYSTEROSCOPY;  Surgeon: Carder Dickie, MD;  Location: [facility];  Service: Gynecology;  Laterality: N/A;   HYSTEROSCOPY WITH NOVASURE N/A 01/02/2018   Procedure: HYSTEROSCOPY WITH NOVASURE;  Surgeon: Carder Dickie, MD;  Location: [facility];  Service: Gynecology;  Laterality: N/A;   KNEE SURGERY Right 03/19/2014   orthoscopic arthroscopy   TONSILLECTOMY AND ADENOIDECTOMY      Social History Katie [name]  reports that she has never smoked. She has never used smokeless tobacco. She reports that she does not currently use alcohol. She reports that she does not use drugs.  family history includes Diabetes in her maternal grandfather and mother; Heart attack in her maternal grandmother and mother; Heart disease in her mother; Hypertension in her mother; Migraines in her mother; Multiple sclerosis in her mother; Thyroid disease in her mother.  Allergies  Allergen Reactions   Penicillins Hives    Has patient had a PCN reaction causing immediate rash, facial/tongue/throat swelling, SOB or lightheadedness with hypotension: no Has patient had a PCN reaction causing severe rash involving mucus membranes or skin necrosis: no Has patient had a PCN reaction that required hospitalization no Has patient had a PCN reaction occurring within the last 10 years: yes If all of the above answers are NO, then may proceed with Cephalosporin use.    Propofol  Nausea And Vomiting    Abnormally sick   Oxycodone  Nausea Only       PHYSICAL EXAMINATION: Vital signs: BP 99/60   Pulse 64   Ht 5' 4 (1.626 m)  Wt 216 lb 6 oz (98.1 kg)   BMI 37.14 kg/m  General: Well-developed, well-nourished, no acute distress HEENT: Sclerae are anicteric. Heart: Regular Abdomen: soft, nontender, nondistended, no obvious ascites, no peritoneal signs, normal bowel sounds. No organomegaly.  Previous surgical incisions well-healed.  No hernia Extremities: No clubbing or cyanosis.  2+ edema Psychiatric: alert and oriented x3.  Cooperative  ASSESSMENT:  1.  Anal fissure.  Resolved with diltiazem ointment, fiber, and sitz bath 2.  Colon cancer screening.  Appropriate candidate without contraindication 3.  Morbid obesity with prior duodenal switch procedure 4.  Status post cholecystectomy 5.  Status post ventral hernia repair   PLAN:  1.  Prescribed additional diltiazem ointment should she have problems in the future. 2.  Continue daily fiber supplementation 3.  Schedule screening colonoscopy.The nature of the procedure, as well as the risks, benefits, and alternatives were carefully and thoroughly reviewed with the patient. Ample time for discussion and questions allowed. The patient understood, was satisfied, and agreed to proceed. A total time of 30 minutes was spent preparing to see the patient, obtaining comprehensive interval history, performing medically appropriate physical exam, counseling and educating the patient regarding the above listed issues, ordering medication, ordering colonoscopy, and documenting clinical information in the health record

## 2024-04-15 NOTE — Progress Notes (Signed)
 Cardiology Office Note:    Date:  04/25/2024   ID:  Katie [name], DOB 31-Dec-1975, MRN [id number]  PCP:  Juliane Che, PA   [facility] Providers Cardiologist:  None     Referring MD: Juliane Che, PA   Chief Complaint  Patient presents with   Palpitations    History of Present Illness:    Katie [name] is a 48 y.o. female is seen for follow up  of palpitations. She has a history of obesity. No longer on CPAP.   Has undergone bariatric surgery 12 years ago. States she lost 175 lbs.   She reports that for several months she has experienced palpitations. This is a skipping, forceful beat. Feels out of rhythm. May be uncomfortable. No dizziness or syncope. May occur with rest or exertion. It is happening daily. She drinks 2-3 caffeine drinks a day. No Etoh or tobacco. Is active. No recent changes in lifestyle or medication.   Echo was done and is normal.   She was started on Toprol  XL 25 mg daily. Notes significant improvement in palpitations to 1-2 times a day rather than all day. BP tends to run low but she is asymptomatic.   Past Medical History:  Diagnosis Date   AMA (advanced maternal age) multigravida 35+    Anemia    WITH PREGNANCY   Anxiety    Arthritis    OSTEOARTHRITIS   Asthma    as a child   Carpal tunnel syndrome    only first pregnancy   Gestational diabetes    Hemorrhoid    Morbid obesity (HCC)    PONV (postoperative nausea and vomiting)    Sleep apnea    USES C-PAP    Past Surgical History:  Procedure Laterality Date   BARIATRIC SURGERY  2018   CESAREAN SECTION WITH BILATERAL TUBAL LIGATION Bilateral 04/08/2016   Procedure: CESAREAN SECTION WITH BILATERAL TUBAL LIGATION;  Surgeon: Dickie Carder, MD;  Location: [facility];  Service: Obstetrics;  Laterality: Bilateral;   CHOLECYSTECTOMY     2002   DILATATION & CURETTAGE/HYSTEROSCOPY WITH MYOSURE N/A 01/02/2018   Procedure: DILATATION &  CURETTAGE/HYSTEROSCOPY;  Surgeon: Carder Dickie, MD;  Location: [facility];  Service: Gynecology;  Laterality: N/A;   HYSTEROSCOPY WITH NOVASURE N/A 01/02/2018   Procedure: HYSTEROSCOPY WITH NOVASURE;  Surgeon: Carder Dickie, MD;  Location: [facility];  Service: Gynecology;  Laterality: N/A;   KNEE SURGERY Right 03/19/2014   orthoscopic arthroscopy   TONSILLECTOMY AND ADENOIDECTOMY      Current Medications: Current Meds  Medication Sig   acetaminophen  (TYLENOL ) 325 MG tablet Take 325 mg by mouth.   buPROPion (WELLBUTRIN SR) 150 MG 12 hr tablet Take 150 mg by mouth.   Calcium 200 MG TABS Take 400 mg by mouth 3 (three) times daily.   Cholecalciferol (VITAMIN D-3 PO) Take 125 mcg by mouth daily.   diltiazem  2 % GEL Apply 1 Application topically 3 (three) times daily.   ergocalciferol (VITAMIN D2) 1.25 MG (50000 UT) capsule Take by mouth. 3 times a week   ferrous sulfate 325 (65 FE) MG tablet Take 325 mg by mouth daily with breakfast.   metoprolol  succinate (TOPROL  XL) 25 MG 24 hr tablet Take 1 tablet (25 mg total) by mouth daily.   Multiple Vitamin (MULTIVITAMIN WITH MINERALS) TABS tablet Take 1 tablet by mouth daily.   omeprazole (PRILOSEC) 20 MG capsule Take 20 mg by mouth daily.   sertraline  (ZOLOFT ) 100 MG tablet Take 100 mg by  mouth every evening.      Allergies:   Penicillins, Propofol , and Oxycodone    Social History   Socioeconomic History   Marital status: Media Planner    Spouse name: Not on file   Number of children: 3   Years of education: Not on file   Highest education level: Not on file  Occupational History   Not on file  Tobacco Use   Smoking status: Never   Smokeless tobacco: Never  Vaping Use   Vaping status: Never Used  Substance and Sexual Activity   Alcohol use: Not Currently    Alcohol/week: 0.0 standard drinks of alcohol    Comment: occ   Drug use: No   Sexual activity: Yes    Birth control/protection: None  Other Topics Concern   Not on file   Social History Narrative   Not on file   Social Drivers of Health   Tobacco Use: Low Risk (04/25/2024)   Patient History    Smoking Tobacco Use: Never    Smokeless Tobacco Use: Never    Passive Exposure: Not on file  Financial Resource Strain: Patient Declined (02/13/2024)   Received from [facility]   Overall Financial Resource Strain (CARDIA)    How hard is it for you to pay for the very basics like food, housing, medical care, and heating?: Patient declined  Food Insecurity: No Food Insecurity (02/13/2024)   Received from [facility]   Epic    Within the past 12 months, you worried that your food would run out before you got the money to buy more.: Never true    Within the past 12 months, the food you bought just didn't last and you didn't have money to get more.: Never true  Transportation Needs: No Transportation Needs (02/13/2024)   Received from [facility]   Epic    In the past 12 months, has lack of transportation kept you from medical appointments or from getting medications?: No    In the past 12 months, has lack of transportation kept you from meetings, work, or from getting things needed for daily living?: No  Physical Activity: Insufficiently Active (02/13/2024)   Received from [facility]   Exercise Vital Sign    On average, how many days per week do you engage in moderate to strenuous exercise (like a brisk walk)?: 2 days    On average, how many minutes do you engage in exercise at this level?: 30 min  Stress: Stress Concern Present (02/13/2024)   Received from [facility] Occup[facility] - Occupational Stress Questionnaire    Do you feel stress - tense, restless, nervous, or anxious, or unable to sleep at night because your mind is troubled all the time - these days?: Rather much  Social Connections: Moderately Integrated (02/13/2024)   Received from [facility]   Social Network    How would you rate your social network (family,  work, friends)?: Adequate participation with social networks  Depression (PHQ2-9): Not on file  Alcohol Screen: Not on file  Housing: Unknown (02/13/2024)   Received from [facility]    In the last 12 months, was there a time when you were not able to pay the mortgage or rent on time?: Patient declined    In the past 12 months, how many times have you moved where you were living?: 0    At any time in the past 12 months, were you homeless or living in  a shelter (including now)?: No  Utilities: Not At Risk (02/13/2024)   Received from [facility]    In the past 12 months has the electric, gas, oil, or water company threatened to shut off services in your home?: No  Health Literacy: Not on file     Family History: The patient's family history includes Diabetes in her maternal grandfather and mother; Heart attack in her maternal grandmother and mother; Heart disease in her mother; Hypertension in her mother; Migraines in her mother; Multiple sclerosis in her mother; Thyroid disease in her mother. There is no history of Anesthesia problems, Hypotension, Malignant hyperthermia, or Pseudochol deficiency.  ROS:   Please see the history of present illness.     All other systems reviewed and are negative.  EKGs/Labs/Other Studies Reviewed:    The following studies were reviewed today:     Echo 03/07/24: IMPRESSIONS     1. Frequent ventricular ectopy noted. Left ventricular ejection fraction,  by estimation, is 60 to 65%. The left ventricle has normal function. The  left ventricle has no regional wall motion abnormalities. Left ventricular  diastolic parameters were  normal.   2. Right ventricular systolic function is normal. The right ventricular  size is normal.   3. The mitral valve is normal in structure. No evidence of mitral valve  regurgitation. No evidence of mitral stenosis.   4. The aortic valve is normal in structure. Aortic valve regurgitation is  not  visualized. No aortic stenosis is present.   5. The inferior vena cava is normal in size with greater than 50%  respiratory variability, suggesting right atrial pressure of 3 mmHg.   Recent Labs: No results found for requested labs within last 365 days.  Recent Lipid Panel    Component Value Date/Time   CHOL 122 09/14/2011 1702   TRIG 64 09/14/2011 1702   HDL 38 (L) 09/14/2011 1702   CHOLHDL 3.2 09/14/2011 1702   VLDL 13 09/14/2011 1702   LDLCALC 71 09/14/2011 1702   Dated 02/08/24: TSH and CMET normal. Cholesterool 78, triglycerides 43, HDL 39, LDL 27. Hgb 11.8 with low ferritin. Repeat Hgb 12.5 on 02/14/24  Risk Assessment/Calculations:                Physical Exam:    VS:  BP 108/68   Pulse (!) 57   Ht 5' 3 (1.6 m)   Wt 220 lb 3.2 oz (99.9 kg)   SpO2 97%   BMI 39.01 kg/m     Wt Readings from Last 3 Encounters:  04/25/24 220 lb 3.2 oz (99.9 kg)  04/04/24 216 lb 6 oz (98.1 kg)  02/23/24 211 lb (95.7 kg)     GEN:  Well nourished, well developed in no acute distress HEENT: Normal NECK: No JVD; No carotid bruits LYMPHATICS: No lymphadenopathy CARDIAC: RRR ,no murmurs, rubs, gallops RESPIRATORY:  Clear to auscultation without rales, wheezing or rhonchi  ABDOMEN: Soft, non-tender, non-distended MUSCULOSKELETAL:  No edema; No deformity  SKIN: Warm and dry NEUROLOGIC:  Alert and oriented x 3 PSYCHIATRIC:  Normal affect   ASSESSMENT:    1. Palpitations   2. PVC's (premature ventricular contractions)     PLAN:    In order of problems listed above:  Palpitations. Secondary to PVCs These appear to be monomorphic. Electrolytes ok. Recommend limiting caffeine intake. Echo is normal.  Symptoms are much better on low dose Toprol  XL 25 mg daily. Will continue. Follow up in one year  Medication Adjustments/Labs and Tests Ordered: Current medicines are reviewed at length with the patient today.  Concerns regarding medicines are outlined above.  No orders  of the defined types were placed in this encounter.  No orders of the defined types were placed in this encounter.   Patient Instructions  Medication Instructions:   *If you need a refill on your cardiac medications before your next appointment, please call your pharmacy*  Lab Work:   Testing/Procedures:   Follow-Up: At [facility], you and your health needs are our priority.  As part of our continuing mission to provide you with exceptional heart care, our providers are all part of one team.  This team includes your primary Cardiologist (physician) and Advanced Practice Providers or APPs (Physician Assistants and Nurse Practitioners) who all work together to provide you with the care you need, when you need it.  Your next appointment:      Provider:     We recommend signing up for the patient portal called MyChart.  Sign up information is provided on this After Visit Summary.  MyChart is used to connect with patients for Virtual Visits (Telemedicine).  Patients are able to view lab/test results, encounter notes, upcoming appointments, etc.  Non-urgent messages can be sent to your provider as well.   To learn more about what you can do with MyChart, go to [email or web address].      Signed, Kathrynne Kulinski, MD  04/25/2024 11:37 AM     [facility]

## 2024-04-25 ENCOUNTER — Ambulatory Visit: Attending: Cardiology | Admitting: Cardiology

## 2024-04-25 ENCOUNTER — Encounter: Payer: Self-pay | Admitting: Cardiology

## 2024-04-25 VITALS — BP 108/68 | HR 57 | Ht 63.0 in | Wt 220.2 lb

## 2024-04-25 DIAGNOSIS — R002 Palpitations: Secondary | ICD-10-CM | POA: Diagnosis not present

## 2024-04-25 DIAGNOSIS — I493 Ventricular premature depolarization: Secondary | ICD-10-CM | POA: Insufficient documentation

## 2024-04-25 NOTE — Patient Instructions (Addendum)
 Medication Instructions:  Continue same medications *If you need a refill on your cardiac medications before your next appointment, please call your pharmacy*  Lab Work: None ordered  Testing/Procedures: None ordered  Follow-Up: At [facility], you and your health needs are our priority.  As part of our continuing mission to provide you with exceptional heart care, our providers are all part of one team.  This team includes your primary Cardiologist (physician) and Advanced Practice Providers or APPs (Physician Assistants and Nurse Practitioners) who all work together to provide you with the care you need, when you need it.  Your next appointment:  1 year     Call in August to schedule Dec appointment     Provider:  Dr.Jordan   We recommend signing up for the patient portal called MyChart.  Sign up information is provided on this After Visit Summary.  MyChart is used to connect with patients for Virtual Visits (Telemedicine).  Patients are able to view lab/test results, encounter notes, upcoming appointments, etc.  Non-urgent messages can be sent to your provider as well.   To learn more about what you can do with MyChart, go to [email or web address].

## 2024-04-29 ENCOUNTER — Ambulatory Visit: Admitting: Internal Medicine

## 2024-04-29 ENCOUNTER — Encounter: Payer: Self-pay | Admitting: Internal Medicine

## 2024-04-29 VITALS — BP 116/71 | HR 54 | Temp 97.4°F | Resp 18 | Ht 64.0 in | Wt 216.0 lb

## 2024-04-29 DIAGNOSIS — K5730 Diverticulosis of large intestine without perforation or abscess without bleeding: Secondary | ICD-10-CM

## 2024-04-29 DIAGNOSIS — Z1211 Encounter for screening for malignant neoplasm of colon: Secondary | ICD-10-CM

## 2024-04-29 DIAGNOSIS — K602 Anal fissure, unspecified: Secondary | ICD-10-CM | POA: Diagnosis not present

## 2024-04-29 DIAGNOSIS — D129 Benign neoplasm of anus and anal canal: Secondary | ICD-10-CM

## 2024-04-29 DIAGNOSIS — D128 Benign neoplasm of rectum: Secondary | ICD-10-CM | POA: Diagnosis not present

## 2024-04-29 DIAGNOSIS — F419 Anxiety disorder, unspecified: Secondary | ICD-10-CM | POA: Diagnosis not present

## 2024-04-29 DIAGNOSIS — G4730 Sleep apnea, unspecified: Secondary | ICD-10-CM | POA: Diagnosis not present

## 2024-04-29 MED ORDER — SODIUM CHLORIDE 0.9 % IV SOLN: 500.0000 mL | Freq: Once | INTRAVENOUS | Status: DC

## 2024-04-29 NOTE — Op Note (Signed)
 [facility] Patient Name: Katie [name] Procedure Date: 04/29/2024 1:36 PM MRN: [id number] Endoscopist: Norleen SAILOR. Abran , MD, [id number] Age: 48 Referring MD:  Date of Birth: 1975/10/29 Gender: Female Account #: [id number] Procedure:                Colonoscopy with cold snare polypectomy x 1 Indications:              Screening for colorectal malignant neoplasm Medicines:                Monitored Anesthesia Care Procedure:                Pre-Anesthesia Assessment:                           - Prior to the procedure, a History and Physical                            was performed, and patient medications and                            allergies were reviewed. The patient's tolerance of                            previous anesthesia was also reviewed. The risks                            and benefits of the procedure and the sedation                            options and risks were discussed with the patient.                            All questions were answered, and informed consent                            was obtained. Prior Anticoagulants: The patient has                            taken no anticoagulant or antiplatelet agents. ASA                            Grade Assessment: II - A patient with mild systemic                            disease. After reviewing the risks and benefits,                            the patient was deemed in satisfactory condition to                            undergo the procedure.                           After obtaining informed consent, the colonoscope  was passed under direct vision. Throughout the                            procedure, the patient's blood pressure, pulse, and                            oxygen saturations were monitored continuously. The                            Olympus Scope [id number] was introduced through the                            anus and advanced to the the cecum, identified by                             appendiceal orifice and ileocecal valve. The                            ileocecal valve, appendiceal orifice, and rectum                            were photographed. The quality of the bowel                            preparation was inadequate. The colonoscopy was                            performed without difficulty. The patient tolerated                            the procedure well. The bowel preparation used was                            SUPREP via split dose instruction. Scope In: 1:56:42 PM Scope Out: 2:09:55 PM Scope Withdrawal Time: 0 hours 6 minutes 42 seconds  Total Procedure Duration: 0 hours 13 minutes 13 seconds  Findings:                 A 5 mm polyp was found in the rectum. The polyp was                            removed with a cold snare. Resection and retrieval                            were complete.                           Diverticula were found in the left colon.                           The exam was otherwise without abnormality on                            direct and retroflexion views. However, examination  severely compromised by preparation. Complications:            No immediate complications. Estimated blood loss:                            None. Estimated Blood Loss:     Estimated blood loss: none. Impression:               - Preparation of the colon was inadequate. The exam                            was carried out to the cecum.                           - One 5 mm polyp in the rectum, removed with a cold                            snare. Resected and retrieved.                           - Diverticulosis in the left colon.                           - The examination was otherwise normal on direct                            and retroflexion views. Examination was compromised                            by inadequate preparation. Recommendation:           - Repeat colonoscopy in 612 months for                             surveillance. WILL NEED MORE EXTENSIVE PREP                           - Patient has a contact number available for                            emergencies. The signs and symptoms of potential                            delayed complications were discussed with the                            patient. Return to normal activities tomorrow.                            Written discharge instructions were provided to the                            patient.                           - Resume previous diet.                           -  Continue present medications.                           - Await pathology results. Norleen SAILOR. Abran, MD 04/29/2024 2:23:14 PM This report has been signed electronically.

## 2024-04-29 NOTE — Progress Notes (Signed)
 Report to PACU, RN, vss, BBS= Clear.

## 2024-04-29 NOTE — Progress Notes (Signed)
 Called to room to assist during endoscopic procedure.  Patient ID and intended procedure confirmed with present staff. Received instructions for my participation in the procedure from the performing physician.

## 2024-04-29 NOTE — Progress Notes (Signed)
 Expand All Collapse All HISTORY OF PRESENT ILLNESS:   Katie [name] is a 48 y.o. female, former psychiatric nurse, with morbid obesity status post laparoscopic duodenal switch procedure 2018, status postcholecystectomy 2002, status post ventral hernia repair 2020.  She was initially evaluated February 14, 2024 complaining of rectal pain and rectal bleeding.  At that time she was diagnosed with an anal fissure.  She was treated with diltiazem  ointment, Citrucel supplementation, and sitz bath's.  She presents today for follow-up as requested.   She is pleased to report that her problems with rectal pain and bleeding have resolved.  She does notice a skin tag when applying her medication.  She has no new complaints.  She did report having had a colonoscopy least 10 to 15 years ago.  We discussed colon cancer screening.  She is interested.   Laboratories: February 14, 2024.  Normal CBC with hemoglobin 12 point   REVIEW OF SYSTEMS:   All non-GI ROS negative except for anxiety, depression, headaches, heart rhythm change, lower extremity edema, and urinary leakage       Past Medical History:  Diagnosis Date   AMA (advanced maternal age) multigravida 35+     Anemia      WITH PREGNANCY   Anxiety     Arthritis      OSTEOARTHRITIS   Asthma      as a child   Carpal tunnel syndrome      only first pregnancy   Gestational diabetes     Hemorrhoid     Morbid obesity (HCC)     PONV (postoperative nausea and vomiting)     Sleep apnea      USES C-PAP               Past Surgical History:  Procedure Laterality Date   BARIATRIC SURGERY   2018   CESAREAN SECTION WITH BILATERAL TUBAL LIGATION Bilateral 04/08/2016    Procedure: CESAREAN SECTION WITH BILATERAL TUBAL LIGATION;  Surgeon: Dickie Carder, MD;  Location: [facility];  Service: Obstetrics;  Laterality: Bilateral;   CHOLECYSTECTOMY        2002   DILATATION & CURETTAGE/HYSTEROSCOPY WITH MYOSURE N/A 01/02/2018     Procedure: DILATATION & CURETTAGE/HYSTEROSCOPY;  Surgeon: Carder Dickie, MD;  Location: [facility];  Service: Gynecology;  Laterality: N/A;   HYSTEROSCOPY WITH NOVASURE N/A 01/02/2018    Procedure: HYSTEROSCOPY WITH NOVASURE;  Surgeon: Carder Dickie, MD;  Location: [facility];  Service: Gynecology;  Laterality: N/A;   KNEE SURGERY Right 03/19/2014    orthoscopic arthroscopy   TONSILLECTOMY AND ADENOIDECTOMY              Social History MARINDA TYER  reports that she has never smoked. She has never used smokeless tobacco. She reports that she does not currently use alcohol. She reports that she does not use drugs.   family history includes Diabetes in her maternal grandfather and mother; Heart attack in her maternal grandmother and mother; Heart disease in her mother; Hypertension in her mother; Migraines in her mother; Multiple sclerosis in her mother; Thyroid disease in her mother.   Allergies       Allergies  Allergen Reactions   Penicillins Hives      Has patient had a PCN reaction causing immediate rash, facial/tongue/throat swelling, SOB or lightheadedness with hypotension: no Has patient had a PCN reaction causing severe rash involving mucus membranes or skin necrosis: no Has patient had a PCN reaction that required hospitalization no Has  patient had a PCN reaction occurring within the last 10 years: yes If all of the above answers are NO, then may proceed with Cephalosporin use.     Propofol  Nausea And Vomiting      Abnormally sick   Oxycodone  Nausea Only            PHYSICAL EXAMINATION: Vital signs: BP 99/60   Pulse 64   Ht 5' 4 (1.626 m)   Wt 216 lb 6 oz (98.1 kg)   BMI 37.14 kg/m  General: Well-developed, well-nourished, no acute distress HEENT: Sclerae are anicteric. Heart: Regular Abdomen: soft, nontender, nondistended, no obvious ascites, no peritoneal signs, normal bowel sounds. No organomegaly.  Previous surgical incisions well-healed.  No  hernia Extremities: No clubbing or cyanosis.  2+ edema Psychiatric: alert and oriented x3. Cooperative   ASSESSMENT:   1.  Anal fissure.  Resolved with diltiazem  ointment, fiber, and sitz bath 2.  Colon cancer screening.  Appropriate candidate without contraindication 3.  Morbid obesity with prior duodenal switch procedure 4.  Status post cholecystectomy 5.  Status post ventral hernia repair     PLAN:   1.  Prescribed additional diltiazem  ointment should she have problems in the future. 2.  Continue daily fiber supplementation 3.  Schedule screening colonoscopy.The nature of the procedure, as well as the risks, benefits, and alternatives were carefully and thoroughly reviewed with the patient. Ample time for discussion and questions allowed. The patient understood, was satisfied, and agreed to proceed.

## 2024-04-29 NOTE — Patient Instructions (Addendum)
 Resume previous diet and medications. Awaiting pathology results. Repeat Colonoscopy in 6-12 months for surveillance. Needs more extensive prep.   YOU HAD AN ENDOSCOPIC PROCEDURE TODAY AT THE [facility]:   Refer to the procedure report that was given to you for any specific questions about what was found during the examination.  If the procedure report does not answer your questions, please call your gastroenterologist to clarify.  If you requested that your care partner not be given the details of your procedure findings, then the procedure report has been included in a sealed envelope for you to review at your convenience later.  YOU SHOULD EXPECT: Some feelings of bloating in the abdomen. Passage of more gas than usual.  Walking can help get rid of the air that was put into your GI tract during the procedure and reduce the bloating. If you had a lower endoscopy (such as a colonoscopy or flexible sigmoidoscopy) you may notice spotting of blood in your stool or on the toilet paper. If you underwent a bowel prep for your procedure, you may not have a normal bowel movement for a few days.  Please Note:  You might notice some irritation and congestion in your nose or some drainage.  This is from the oxygen used during your procedure.  There is no need for concern and it should clear up in a day or so.  SYMPTOMS TO REPORT IMMEDIATELY:  Following lower endoscopy (colonoscopy or flexible sigmoidoscopy):  Excessive amounts of blood in the stool  Significant tenderness or worsening of abdominal pains  Swelling of the abdomen that is new, acute  Fever of 100F or higher   For urgent or emergent issues, a gastroenterologist can be reached at any hour by calling [phone]. Do not use MyChart messaging for urgent concerns.    DIET:  We do recommend a small meal at first, but then you may proceed to your regular diet.  Drink plenty of fluids but you should avoid alcoholic beverages for 24  hours.  ACTIVITY:  You should plan to take it easy for the rest of today and you should NOT DRIVE or use heavy machinery until tomorrow (because of the sedation medicines used during the test).    FOLLOW UP: Our staff will call the number listed on your records the next business day following your procedure.  We will call around 7:15- 8:00 am to check on you and address any questions or concerns that you may have regarding the information given to you following your procedure. If we do not reach you, we will leave a message.     If any biopsies were taken you will be contacted by phone or by letter within the next 1-3 weeks.  Please call us  at [phone] if you have not heard about the biopsies in 3 weeks.    SIGNATURES/CONFIDENTIALITY: You and/or your care partner have signed paperwork which will be entered into your electronic medical record.  These signatures attest to the fact that that the information above on your After Visit Summary has been reviewed and is understood.  Full responsibility of the confidentiality of this discharge information lies with you and/or your care-partner.

## 2024-04-29 NOTE — Progress Notes (Signed)
 Pt's states no medical or surgical changes since previsit or office visit.

## 2024-04-30 ENCOUNTER — Telehealth: Payer: Self-pay | Admitting: *Deleted

## 2024-04-30 NOTE — Telephone Encounter (Signed)
°  Follow up Call-     04/29/2024    1:42 PM  Call back number  Post procedure Call Back phone  # [phone]  Permission to leave phone message Yes     Patient questions:  Do you have a fever, pain , or abdominal swelling? No. Pain Score  0 *  Have you tolerated food without any problems? Yes.    Have you been able to return to your normal activities? Yes.    Do you have any questions about your discharge instructions: Diet   No. Medications  No. Follow up visit  No.  Do you have questions or concerns about your Care? No.  Actions: * If pain score is 4 or above: No action needed, pain <4.

## 2024-05-02 LAB — SURGICAL PATHOLOGY

## 2024-05-08 ENCOUNTER — Ambulatory Visit: Payer: Self-pay | Admitting: Internal Medicine

## 2024-05-19 ENCOUNTER — Encounter (HOSPITAL_COMMUNITY): Payer: Self-pay | Admitting: *Deleted

## 2024-05-19 ENCOUNTER — Other Ambulatory Visit: Payer: Self-pay

## 2024-05-19 ENCOUNTER — Emergency Department (HOSPITAL_COMMUNITY)
Admission: EM | Admit: 2024-05-19 | Discharge: 2024-05-19 | Discharge status: Against Medical Advice | Source: Home / Self Care

## 2024-05-19 DIAGNOSIS — R519 Headache, unspecified: Secondary | ICD-10-CM | POA: Diagnosis present

## 2024-05-19 DIAGNOSIS — Z5321 Procedure and treatment not carried out due to patient leaving prior to being seen by health care provider: Secondary | ICD-10-CM | POA: Diagnosis not present

## 2024-05-19 NOTE — ED Triage Notes (Signed)
 The pt is c/o rt sided head pain for one hour she has a raised area to the rt forehead  she usually does not have pain   lmp none

## 2024-05-19 NOTE — ED Notes (Signed)
 Patient stated she is leaving due to long wait.

## 2024-05-27 ENCOUNTER — Other Ambulatory Visit: Payer: Self-pay | Admitting: Urology

## 2024-05-28 ENCOUNTER — Other Ambulatory Visit: Payer: Self-pay | Admitting: Urology

## 2024-06-12 ENCOUNTER — Encounter: Payer: Self-pay | Admitting: Internal Medicine

## 2024-06-27 ENCOUNTER — Encounter (HOSPITAL_COMMUNITY): Admission: RE | Admit: 2024-06-27

## 2024-07-02 ENCOUNTER — Ambulatory Visit (HOSPITAL_COMMUNITY): Admit: 2024-07-02 | Admitting: Urology

## 2024-07-02 SURGERY — CREATION, PUBOVAGINAL SLING
Anesthesia: General
